# Patient Record
Sex: Male | Born: 1950 | Race: Black or African American | Hispanic: No | Marital: Single | Smoking: Former smoker
Health system: Southern US, Community
[De-identification: ages and names within clinical notes are randomized; demographics above are authoritative.]

## PROBLEM LIST (undated history)

## (undated) DIAGNOSIS — I10 Essential (primary) hypertension: Secondary | ICD-10-CM

## (undated) DIAGNOSIS — I2510 Atherosclerotic heart disease of native coronary artery without angina pectoris: Secondary | ICD-10-CM

## (undated) DIAGNOSIS — E785 Hyperlipidemia, unspecified: Secondary | ICD-10-CM

## (undated) DIAGNOSIS — K219 Gastro-esophageal reflux disease without esophagitis: Secondary | ICD-10-CM

## (undated) HISTORY — PX: HERNIA REPAIR: SHX51

## (undated) HISTORY — PX: CARDIAC CATHETERIZATION: SHX172

---

## 2004-10-02 ENCOUNTER — Emergency Department: Payer: Self-pay | Admitting: General Practice

## 2004-10-02 ENCOUNTER — Other Ambulatory Visit: Payer: Self-pay

## 2004-10-10 ENCOUNTER — Other Ambulatory Visit: Payer: Self-pay

## 2004-10-10 ENCOUNTER — Inpatient Hospital Stay: Payer: Self-pay | Admitting: Internal Medicine

## 2004-10-11 ENCOUNTER — Other Ambulatory Visit: Payer: Self-pay

## 2004-10-12 ENCOUNTER — Other Ambulatory Visit: Payer: Self-pay

## 2006-05-27 ENCOUNTER — Ambulatory Visit: Payer: Self-pay | Admitting: Internal Medicine

## 2008-07-04 ENCOUNTER — Emergency Department: Payer: Self-pay | Admitting: Emergency Medicine

## 2009-03-10 ENCOUNTER — Emergency Department: Payer: Self-pay | Admitting: Emergency Medicine

## 2009-03-14 ENCOUNTER — Inpatient Hospital Stay: Payer: Self-pay | Admitting: Internal Medicine

## 2010-06-25 ENCOUNTER — Emergency Department: Payer: Self-pay | Admitting: Internal Medicine

## 2010-12-18 ENCOUNTER — Inpatient Hospital Stay: Payer: Self-pay | Admitting: Internal Medicine

## 2010-12-27 LAB — PATHOLOGY REPORT

## 2011-06-02 ENCOUNTER — Inpatient Hospital Stay: Payer: Self-pay | Admitting: Internal Medicine

## 2011-06-18 ENCOUNTER — Inpatient Hospital Stay: Payer: Self-pay | Admitting: Psychiatry

## 2011-06-29 ENCOUNTER — Inpatient Hospital Stay: Payer: Self-pay | Admitting: Psychiatry

## 2011-09-28 ENCOUNTER — Emergency Department: Payer: Self-pay | Admitting: Emergency Medicine

## 2012-08-15 ENCOUNTER — Emergency Department: Payer: Self-pay | Admitting: Emergency Medicine

## 2012-08-15 LAB — COMPREHENSIVE METABOLIC PANEL
Alkaline Phosphatase: 119 U/L (ref 50–136)
BUN: 16 mg/dL (ref 7–18)
Bilirubin,Total: 0.2 mg/dL (ref 0.2–1.0)
Calcium, Total: 9.8 mg/dL (ref 8.5–10.1)
Chloride: 107 mmol/L (ref 98–107)
Co2: 23 mmol/L (ref 21–32)
Creatinine: 1.12 mg/dL (ref 0.60–1.30)
EGFR (African American): >60
Osmolality: 282 (ref 275–301)
Potassium: 3.9 mmol/L (ref 3.5–5.1)
SGPT (ALT): 20 U/L (ref 12–78)
Total Protein: 8.6 g/dL — ABNORMAL HIGH (ref 6.4–8.2)

## 2012-08-15 LAB — CBC
HCT: 47.4 % (ref 40.0–52.0)
MCH: 30.2 pg (ref 26.0–34.0)
MCHC: 33.5 g/dL (ref 32.0–36.0)
MCV: 90 fL (ref 80–100)
RDW: 13.2 % (ref 11.5–14.5)

## 2012-08-15 LAB — URINALYSIS, COMPLETE
Bacteria: NONE SEEN
Blood: NEGATIVE
Ketone: NEGATIVE
Leukocyte Esterase: NEGATIVE
Nitrite: NEGATIVE
Ph: 5 (ref 4.5–8.0)
WBC UR: 1 /HPF (ref 0–5)

## 2012-08-27 ENCOUNTER — Emergency Department: Payer: Self-pay | Admitting: Emergency Medicine

## 2012-08-27 LAB — COMPREHENSIVE METABOLIC PANEL
Albumin: 5.1 g/dL — ABNORMAL HIGH (ref 3.4–5.0)
Alkaline Phosphatase: 127 U/L (ref 50–136)
Bilirubin,Total: 0.3 mg/dL (ref 0.2–1.0)
Calcium, Total: 10 mg/dL (ref 8.5–10.1)
Co2: 22 mmol/L (ref 21–32)
Creatinine: 1.11 mg/dL (ref 0.60–1.30)
EGFR (African American): >60
EGFR (Non-African Amer.): >60
SGOT(AST): 26 U/L (ref 15–37)
SGPT (ALT): 23 U/L (ref 12–78)
Sodium: 138 mmol/L (ref 136–145)

## 2012-08-27 LAB — URINALYSIS, COMPLETE
Bacteria: NONE SEEN
Bilirubin,UR: NEGATIVE
Blood: NEGATIVE
Glucose,UR: NEGATIVE mg/dL (ref 0–75)
Leukocyte Esterase: NEGATIVE
Protein: NEGATIVE
RBC,UR: NONE SEEN /HPF (ref 0–5)
Squamous Epithelial: <1
WBC UR: 1 /HPF (ref 0–5)

## 2012-08-27 LAB — CBC
HCT: 49 % (ref 40.0–52.0)
HGB: 16.4 g/dL (ref 13.0–18.0)
MCH: 30.5 pg (ref 26.0–34.0)
MCHC: 33.4 g/dL (ref 32.0–36.0)
RDW: 13.7 % (ref 11.5–14.5)
WBC: 10.4 10*3/uL (ref 3.8–10.6)

## 2012-08-27 LAB — ETHANOL
Ethanol %: <0.003 % (ref 0.000–0.080)
Ethanol: <3 mg/dL

## 2012-10-01 ENCOUNTER — Ambulatory Visit: Payer: Self-pay | Admitting: Surgery

## 2012-10-01 LAB — BASIC METABOLIC PANEL
Calcium, Total: 9.1 mg/dL (ref 8.5–10.1)
Chloride: 104 mmol/L (ref 98–107)
Creatinine: 0.96 mg/dL (ref 0.60–1.30)
EGFR (Non-African Amer.): >60
Glucose: 86 mg/dL (ref 65–99)
Potassium: 4.4 mmol/L (ref 3.5–5.1)
Sodium: 137 mmol/L (ref 136–145)

## 2012-10-01 LAB — CBC WITH DIFFERENTIAL/PLATELET
Eosinophil #: 1 10*3/uL — ABNORMAL HIGH (ref 0.0–0.7)
Eosinophil %: 14.6 %
HCT: 37 % — ABNORMAL LOW (ref 40.0–52.0)
HGB: 12.3 g/dL — ABNORMAL LOW (ref 13.0–18.0)
Lymphocyte #: 3.2 10*3/uL (ref 1.0–3.6)
Lymphocyte %: 49.6 %
MCV: 91 fL (ref 80–100)
Monocyte %: 7.8 %
Platelet: 298 10*3/uL (ref 150–440)
RDW: 14.1 % (ref 11.5–14.5)
WBC: 6.5 10*3/uL (ref 3.8–10.6)

## 2012-10-08 ENCOUNTER — Ambulatory Visit: Payer: Self-pay | Admitting: Surgery

## 2012-10-13 ENCOUNTER — Inpatient Hospital Stay: Payer: Self-pay | Admitting: Student

## 2012-10-13 LAB — URINALYSIS, COMPLETE
Bacteria: NONE SEEN
Bilirubin,UR: NEGATIVE
Leukocyte Esterase: NEGATIVE
Nitrite: NEGATIVE
RBC,UR: 1 /HPF (ref 0–5)
Specific Gravity: 1.02 (ref 1.003–1.030)
Squamous Epithelial: 3
WBC UR: 4 /HPF (ref 0–5)

## 2012-10-13 LAB — DRUG SCREEN, URINE
Amphetamines, Ur Screen: NEGATIVE (ref ?–1000)
Barbiturates, Ur Screen: NEGATIVE (ref ?–200)
Benzodiazepine, Ur Scrn: POSITIVE (ref ?–200)
Cocaine Metabolite,Ur ~~LOC~~: NEGATIVE (ref ?–300)
MDMA (Ecstasy)Ur Screen: NEGATIVE (ref ?–500)
Methadone, Ur Screen: NEGATIVE (ref ?–300)
Opiate, Ur Screen: POSITIVE (ref ?–300)
Tricyclic, Ur Screen: NEGATIVE (ref ?–1000)

## 2012-10-13 LAB — CBC
HCT: 37 % — ABNORMAL LOW (ref 40.0–52.0)
MCH: 30.6 pg (ref 26.0–34.0)
MCV: 91 fL (ref 80–100)
RBC: 4.09 10*6/uL — ABNORMAL LOW (ref 4.40–5.90)
RDW: 13.7 % (ref 11.5–14.5)
WBC: 10.2 10*3/uL (ref 3.8–10.6)

## 2012-10-13 LAB — COMPREHENSIVE METABOLIC PANEL
Albumin: 4 g/dL (ref 3.4–5.0)
Alkaline Phosphatase: 102 U/L (ref 50–136)
Anion Gap: 15 (ref 7–16)
BUN: 34 mg/dL — ABNORMAL HIGH (ref 7–18)
Bilirubin,Total: 1.1 mg/dL — ABNORMAL HIGH (ref 0.2–1.0)
Calcium, Total: 9.6 mg/dL (ref 8.5–10.1)
Co2: 31 mmol/L (ref 21–32)
Creatinine: 3.18 mg/dL — ABNORMAL HIGH (ref 0.60–1.30)
EGFR (African American): 23 — ABNORMAL LOW
EGFR (Non-African Amer.): 20 — ABNORMAL LOW
Glucose: 107 mg/dL — ABNORMAL HIGH (ref 65–99)
Osmolality: 284 (ref 275–301)
Potassium: 2.6 mmol/L — ABNORMAL LOW (ref 3.5–5.1)
SGPT (ALT): 17 U/L (ref 12–78)
Sodium: 138 mmol/L (ref 136–145)
Total Protein: 8 g/dL (ref 6.4–8.2)

## 2012-10-13 LAB — MAGNESIUM: Magnesium: 1.6 mg/dL — ABNORMAL LOW

## 2012-10-13 LAB — TROPONIN I
Troponin-I: 0.41 ng/mL — ABNORMAL HIGH
Troponin-I: 0.34 ng/mL — ABNORMAL HIGH

## 2012-10-13 LAB — CK TOTAL AND CKMB (NOT AT ARMC): CK-MB: 5.1 ng/mL — ABNORMAL HIGH (ref 0.5–3.6)

## 2012-10-13 LAB — ETHANOL: Ethanol: <3 mg/dL

## 2012-10-14 LAB — CBC WITH DIFFERENTIAL/PLATELET
Basophil #: 0.1 10*3/uL (ref 0.0–0.1)
Basophil %: 0.6 %
Eosinophil #: 0.1 10*3/uL (ref 0.0–0.7)
Eosinophil %: 0.7 %
Lymphocyte #: 1.6 10*3/uL (ref 1.0–3.6)
Lymphocyte %: 17.4 %
MCH: 30.7 pg (ref 26.0–34.0)
MCV: 91 fL (ref 80–100)
Monocyte #: 0.7 x10 3/mm (ref 0.2–1.0)
Monocyte %: 7.2 %
Neutrophil #: 7 10*3/uL — ABNORMAL HIGH (ref 1.4–6.5)
Neutrophil %: 74.1 %
Platelet: 322 10*3/uL (ref 150–440)
RBC: 3.46 10*6/uL — ABNORMAL LOW (ref 4.40–5.90)
RDW: 13.8 % (ref 11.5–14.5)
WBC: 9.4 10*3/uL (ref 3.8–10.6)

## 2012-10-14 LAB — TROPONIN I: Troponin-I: 0.28 ng/mL — ABNORMAL HIGH

## 2012-10-14 LAB — BASIC METABOLIC PANEL
Anion Gap: 9 (ref 7–16)
BUN: 41 mg/dL — ABNORMAL HIGH (ref 7–18)
Chloride: 105 mmol/L (ref 98–107)
Co2: 29 mmol/L (ref 21–32)
Creatinine: 2.66 mg/dL — ABNORMAL HIGH (ref 0.60–1.30)
EGFR (African American): 28 — ABNORMAL LOW
EGFR (Non-African Amer.): 24 — ABNORMAL LOW

## 2012-10-14 LAB — CK TOTAL AND CKMB (NOT AT ARMC)
CK, Total: 538 U/L — ABNORMAL HIGH (ref 35–232)
CK-MB: 4.9 ng/mL — ABNORMAL HIGH (ref 0.5–3.6)

## 2012-10-15 LAB — BASIC METABOLIC PANEL
BUN: 43 mg/dL — ABNORMAL HIGH (ref 7–18)
Chloride: 104 mmol/L (ref 98–107)
Creatinine: 1.47 mg/dL — ABNORMAL HIGH (ref 0.60–1.30)
EGFR (Non-African Amer.): 50 — ABNORMAL LOW
Glucose: 64 mg/dL — ABNORMAL LOW (ref 65–99)
Osmolality: 290 (ref 275–301)
Potassium: 3 mmol/L — ABNORMAL LOW (ref 3.5–5.1)

## 2012-11-24 ENCOUNTER — Emergency Department: Payer: Self-pay | Admitting: Emergency Medicine

## 2012-11-24 LAB — URINALYSIS, COMPLETE
RBC,UR: 95725 /HPF (ref 0–5)
Squamous Epithelial: NONE SEEN

## 2012-11-24 LAB — CBC
HCT: 35.3 % — ABNORMAL LOW (ref 40.0–52.0)
HGB: 11.6 g/dL — ABNORMAL LOW (ref 13.0–18.0)
MCHC: 32.8 g/dL (ref 32.0–36.0)
MCV: 91 fL (ref 80–100)
Platelet: 395 10*3/uL (ref 150–440)
WBC: 8.4 10*3/uL (ref 3.8–10.6)

## 2012-11-24 LAB — BASIC METABOLIC PANEL
Anion Gap: 9 (ref 7–16)
Chloride: 105 mmol/L (ref 98–107)
Creatinine: 1.36 mg/dL — ABNORMAL HIGH (ref 0.60–1.30)
EGFR (Non-African Amer.): 55 — ABNORMAL LOW
Glucose: 129 mg/dL — ABNORMAL HIGH (ref 65–99)
Osmolality: 277 (ref 275–301)
Potassium: 4 mmol/L (ref 3.5–5.1)
Sodium: 137 mmol/L (ref 136–145)

## 2013-01-13 ENCOUNTER — Inpatient Hospital Stay: Payer: Self-pay | Admitting: Specialist

## 2013-01-13 LAB — COMPREHENSIVE METABOLIC PANEL
Albumin: 4.5 g/dL (ref 3.4–5.0)
Alkaline Phosphatase: 131 U/L (ref 50–136)
Anion Gap: 9 (ref 7–16)
Bilirubin,Total: 0.4 mg/dL (ref 0.2–1.0)
Calcium, Total: 10.2 mg/dL — ABNORMAL HIGH (ref 8.5–10.1)
Chloride: 102 mmol/L (ref 98–107)
Co2: 24 mmol/L (ref 21–32)
Creatinine: 1.38 mg/dL — ABNORMAL HIGH (ref 0.60–1.30)
EGFR (African American): >60
EGFR (Non-African Amer.): 54 — ABNORMAL LOW
Glucose: 125 mg/dL — ABNORMAL HIGH (ref 65–99)
Osmolality: 275 (ref 275–301)
Sodium: 135 mmol/L — ABNORMAL LOW (ref 136–145)
Total Protein: 9.4 g/dL — ABNORMAL HIGH (ref 6.4–8.2)

## 2013-01-13 LAB — CBC
HGB: 13.3 g/dL (ref 13.0–18.0)
MCHC: 32.6 g/dL (ref 32.0–36.0)
RBC: 4.61 10*6/uL (ref 4.40–5.90)
WBC: 7.7 10*3/uL (ref 3.8–10.6)

## 2013-01-13 LAB — URINALYSIS, COMPLETE
Bilirubin,UR: NEGATIVE
Glucose,UR: NEGATIVE mg/dL (ref 0–75)
Nitrite: NEGATIVE
Ph: 5 (ref 4.5–8.0)
Specific Gravity: 1.02 (ref 1.003–1.030)
WBC UR: 551 /HPF (ref 0–5)

## 2013-01-13 LAB — TROPONIN I: Troponin-I: <0.02 ng/mL

## 2013-01-13 LAB — MAGNESIUM: Magnesium: 1.8 mg/dL

## 2013-01-14 LAB — BASIC METABOLIC PANEL
Anion Gap: 10 (ref 7–16)
BUN: 18 mg/dL (ref 7–18)
Chloride: 107 mmol/L (ref 98–107)
Creatinine: 1 mg/dL (ref 0.60–1.30)
EGFR (Non-African Amer.): >60
Glucose: 73 mg/dL (ref 65–99)
Osmolality: 276 (ref 275–301)
Potassium: 3.8 mmol/L (ref 3.5–5.1)
Sodium: 138 mmol/L (ref 136–145)

## 2013-01-14 LAB — CBC WITH DIFFERENTIAL/PLATELET
Basophil %: 0.5 %
Eosinophil #: 0.2 10*3/uL (ref 0.0–0.7)
HGB: 11.4 g/dL — ABNORMAL LOW (ref 13.0–18.0)
Lymphocyte #: 2.6 10*3/uL (ref 1.0–3.6)
Lymphocyte %: 41.3 %
MCHC: 33.1 g/dL (ref 32.0–36.0)
MCV: 88 fL (ref 80–100)
Monocyte #: 0.6 x10 3/mm (ref 0.2–1.0)
Monocyte %: 9.5 %
Neutrophil #: 2.9 10*3/uL (ref 1.4–6.5)
Platelet: 337 10*3/uL (ref 150–440)
RDW: 13.5 % (ref 11.5–14.5)
WBC: 6.4 10*3/uL (ref 3.8–10.6)

## 2013-01-15 LAB — URINE CULTURE

## 2013-03-27 ENCOUNTER — Emergency Department: Payer: Self-pay | Admitting: Emergency Medicine

## 2013-03-27 DIAGNOSIS — S066X0A Traumatic subarachnoid hemorrhage without loss of consciousness, initial encounter: Secondary | ICD-10-CM | POA: Insufficient documentation

## 2013-03-27 LAB — COMPREHENSIVE METABOLIC PANEL
Albumin: 4.4 g/dL (ref 3.4–5.0)
Alkaline Phosphatase: 114 U/L (ref 50–136)
Anion Gap: 9 (ref 7–16)
Calcium, Total: 9.7 mg/dL (ref 8.5–10.1)
Chloride: 108 mmol/L — ABNORMAL HIGH (ref 98–107)
Creatinine: 2.51 mg/dL — ABNORMAL HIGH (ref 0.60–1.30)
Osmolality: 275 (ref 275–301)
Potassium: 4.5 mmol/L (ref 3.5–5.1)
SGOT(AST): 27 U/L (ref 15–37)
SGPT (ALT): 18 U/L (ref 12–78)
Total Protein: 8.7 g/dL — ABNORMAL HIGH (ref 6.4–8.2)

## 2013-03-27 LAB — CBC WITH DIFFERENTIAL/PLATELET
Eosinophil #: 1.3 10*3/uL — ABNORMAL HIGH (ref 0.0–0.7)
Eosinophil %: 14.3 %
HGB: 13 g/dL (ref 13.0–18.0)
Lymphocyte #: 3.6 10*3/uL (ref 1.0–3.6)
Lymphocyte %: 40.9 %
MCH: 29 pg (ref 26.0–34.0)
MCHC: 33.2 g/dL (ref 32.0–36.0)
Monocyte %: 8.4 %
Neutrophil #: 3.2 10*3/uL (ref 1.4–6.5)
Platelet: 301 10*3/uL (ref 150–440)
RBC: 4.48 10*6/uL (ref 4.40–5.90)
RDW: 14.3 % (ref 11.5–14.5)
WBC: 8.9 10*3/uL (ref 3.8–10.6)

## 2013-03-27 LAB — ETHANOL: Ethanol: <3 mg/dL

## 2013-03-27 LAB — CK TOTAL AND CKMB (NOT AT ARMC): CK-MB: 2.2 ng/mL (ref 0.5–3.6)

## 2013-03-30 DIAGNOSIS — S37009A Unspecified injury of unspecified kidney, initial encounter: Secondary | ICD-10-CM | POA: Insufficient documentation

## 2013-03-30 DIAGNOSIS — R799 Abnormal finding of blood chemistry, unspecified: Secondary | ICD-10-CM | POA: Insufficient documentation

## 2013-03-31 DIAGNOSIS — Z720 Tobacco use: Secondary | ICD-10-CM | POA: Insufficient documentation

## 2013-05-23 ENCOUNTER — Emergency Department: Payer: Self-pay | Admitting: Emergency Medicine

## 2013-10-21 ENCOUNTER — Observation Stay: Payer: Self-pay | Admitting: Internal Medicine

## 2013-10-21 LAB — COMPREHENSIVE METABOLIC PANEL
Albumin: 3.9 g/dL (ref 3.4–5.0)
Alkaline Phosphatase: 113 U/L
Co2: 24 mmol/L (ref 21–32)
Creatinine: 1.19 mg/dL (ref 0.60–1.30)
EGFR (African American): >60
Glucose: 87 mg/dL (ref 65–99)
Osmolality: 272 (ref 275–301)
SGOT(AST): 32 U/L (ref 15–37)
SGPT (ALT): 17 U/L (ref 12–78)
Sodium: 135 mmol/L — ABNORMAL LOW (ref 136–145)

## 2013-10-21 LAB — CBC
HCT: 37.5 % — ABNORMAL LOW (ref 40.0–52.0)
HGB: 12.6 g/dL — ABNORMAL LOW (ref 13.0–18.0)
MCH: 29.8 pg (ref 26.0–34.0)
MCHC: 33.5 g/dL (ref 32.0–36.0)
MCV: 89 fL (ref 80–100)
Platelet: 314 10*3/uL (ref 150–440)
RBC: 4.21 10*6/uL — ABNORMAL LOW (ref 4.40–5.90)

## 2013-10-21 LAB — LIPID PANEL
Cholesterol: 122 mg/dL (ref 0–200)
Ldl Cholesterol, Calc: 57 mg/dL (ref 0–100)
Triglycerides: 62 mg/dL (ref 0–200)
VLDL Cholesterol, Calc: 12 mg/dL (ref 5–40)

## 2013-10-21 LAB — TROPONIN I: Troponin-I: <0.02 ng/mL

## 2013-10-21 LAB — CK TOTAL AND CKMB (NOT AT ARMC): CK, Total: 67 U/L (ref 35–232)

## 2013-10-21 LAB — HEMOGLOBIN A1C: Hemoglobin A1C: 5.5 % (ref 4.2–6.3)

## 2013-10-22 LAB — CBC WITH DIFFERENTIAL/PLATELET
Eosinophil: 13 %
HGB: 11.7 g/dL — ABNORMAL LOW (ref 13.0–18.0)
Lymphocytes: 61 %
MCV: 88 fL (ref 80–100)
Monocytes: 3 %
Platelet: 314 10*3/uL (ref 150–440)
RBC: 3.91 10*6/uL — ABNORMAL LOW (ref 4.40–5.90)
RDW: 13.2 % (ref 11.5–14.5)
Segmented Neutrophils: 22 %
WBC: 6.6 10*3/uL (ref 3.8–10.6)

## 2013-10-22 LAB — BASIC METABOLIC PANEL
Anion Gap: 6 — ABNORMAL LOW (ref 7–16)
Chloride: 111 mmol/L — ABNORMAL HIGH (ref 98–107)
EGFR (Non-African Amer.): >60
Glucose: 87 mg/dL (ref 65–99)
Potassium: 4.3 mmol/L (ref 3.5–5.1)
Sodium: 141 mmol/L (ref 136–145)

## 2014-03-25 DIAGNOSIS — F17200 Nicotine dependence, unspecified, uncomplicated: Secondary | ICD-10-CM | POA: Insufficient documentation

## 2014-03-25 DIAGNOSIS — R079 Chest pain, unspecified: Secondary | ICD-10-CM | POA: Insufficient documentation

## 2014-03-25 DIAGNOSIS — I209 Angina pectoris, unspecified: Secondary | ICD-10-CM | POA: Insufficient documentation

## 2014-05-21 IMAGING — CR DG CHEST 2V
1 series · 2 of 2 positions shown · non-contrast
Comparison: 03/27/2013

CLINICAL DATA: Chest pain.

EXAM:
CHEST  2 VIEW

[Series 1: pa · 0.17mm/px · 2 of 2 slices shown]
[im 1/2]
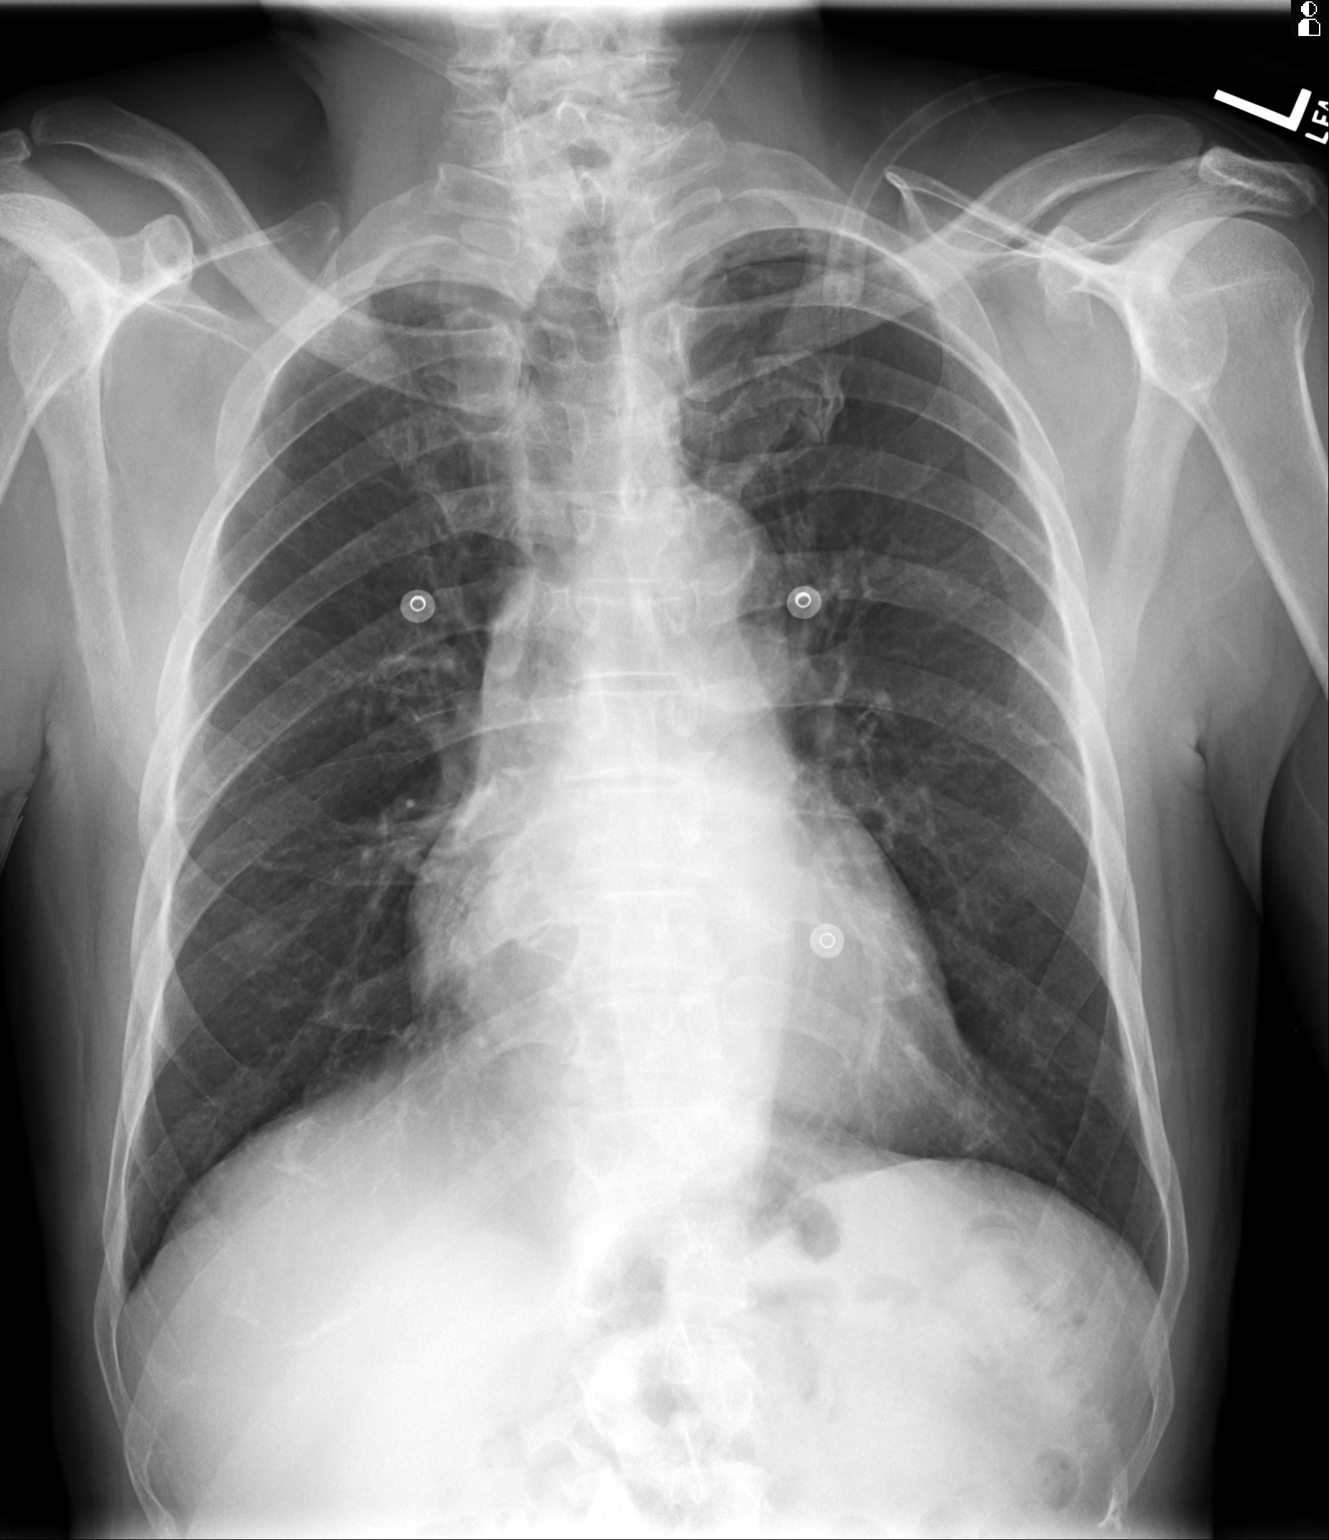
[im 2/2]
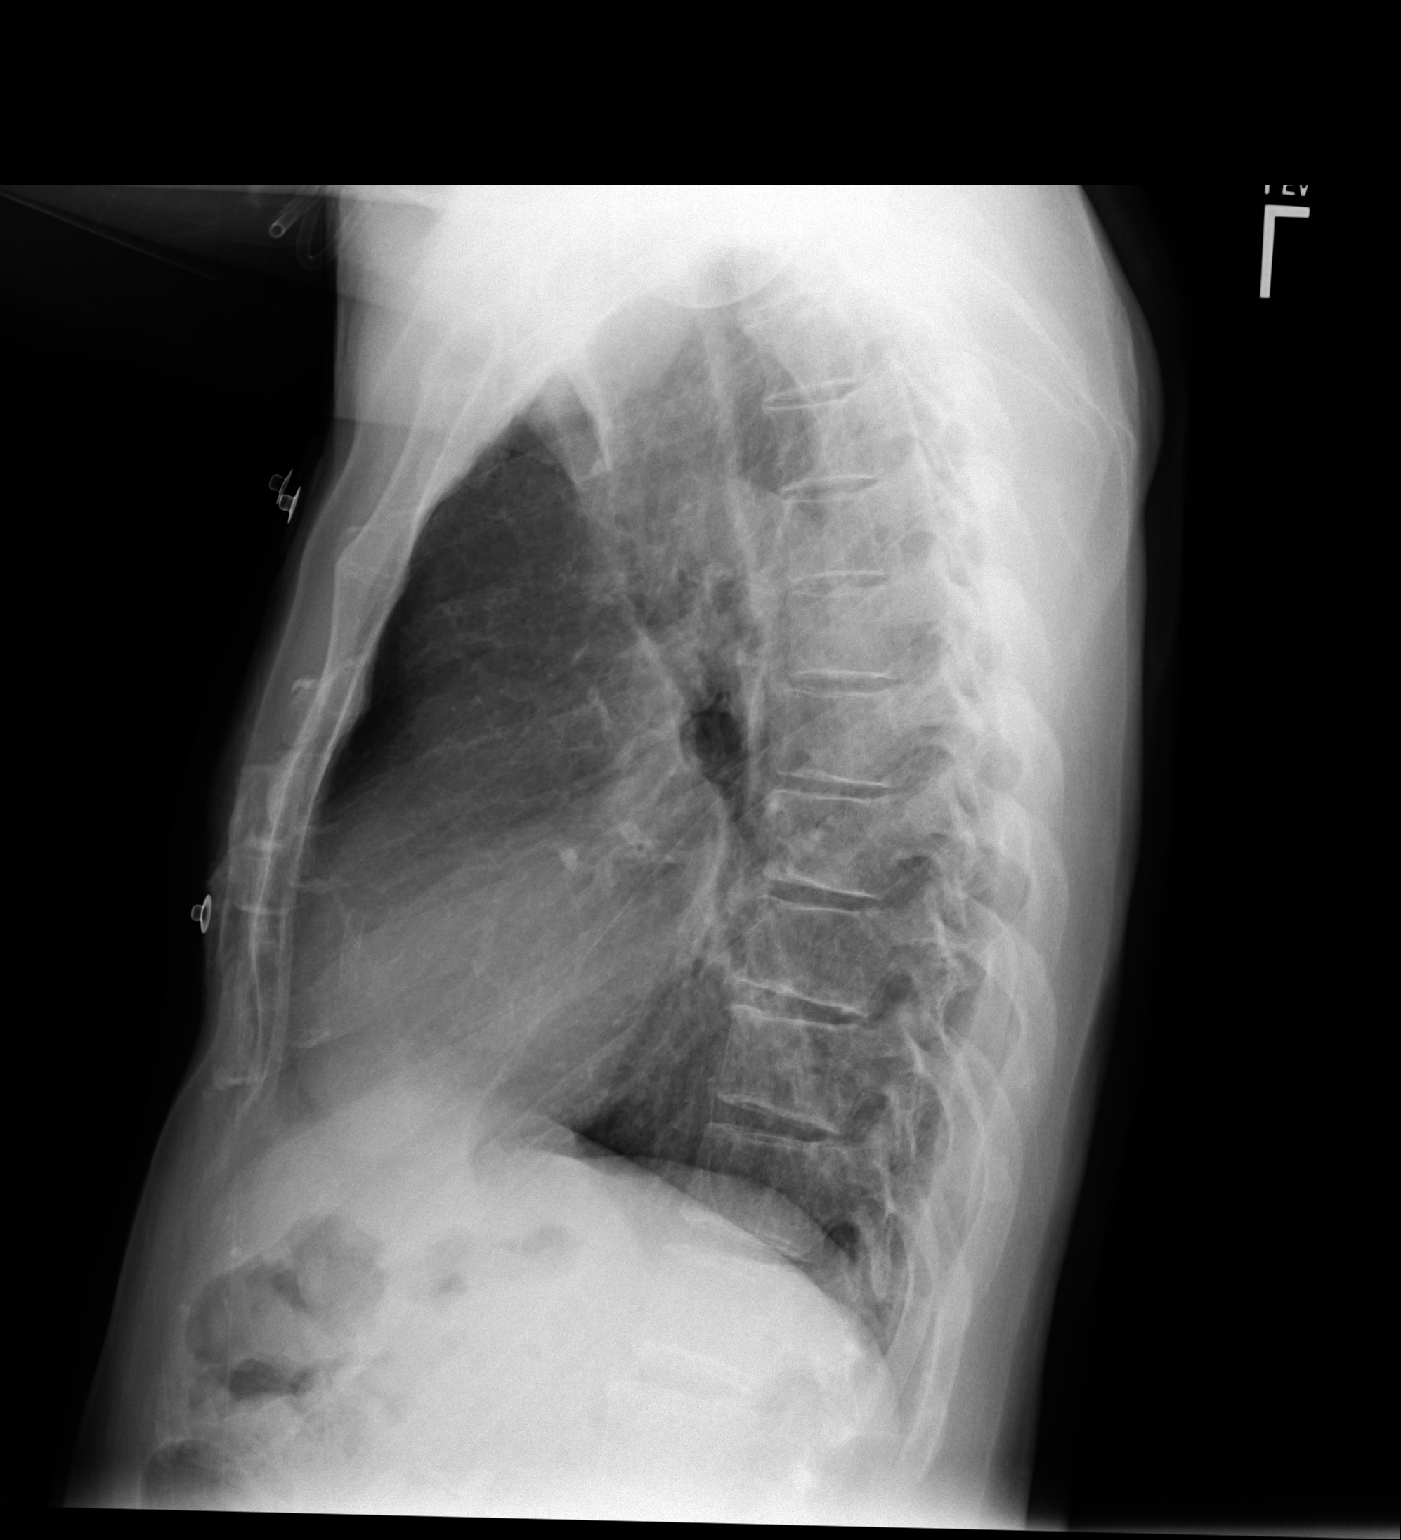

[2 of 2 positions shown; findings below may reference images not displayed]

FINDINGS: The cardiac silhouette is enlarged. No focal regions of
consolidation or focal infiltrates. There is flattening of the
hemidiaphragms. The osseous structures unremarkable.
IMPRESSION: COPD without evidence of acute cardiopulmonary disease.

## 2015-02-22 NOTE — Discharge Summary (Signed)
PATIENT NAME:  Antonio Ford, Antonio Ford MR#:  [id number] DATE OF BIRTH:  Dec 23, 1950  DATE OF ADMISSION:  10/13/2012 DATE OF DISCHARGE:  10/16/2012  PRIMARY CARE PHYSICIAN: Dr. Maryellen PileEason   CHIEF COMPLAINT: Abdominal pain.   DISCHARGE DIAGNOSES:  1. Ileus.  2. Acute renal failure.  3. Hypokalemia.  4. Hypomagnesemia.  5. Accelerated hypertension.  6. History of schizophrenia. 7. Positive troponin, likely demand ischemia and renal failure.  8. History of hypothyroidism.  9. Diabetes.  10. Down syndrome. 11. Alcohol abuse.  12. Peripheral arterial disease.  13. Gastroesophageal reflux disease.  14. Hydrocephalus history.  15. History of cardiac stent.  16. History of recent inguinal hernia repair.   DISCHARGE MEDICATIONS:  1. Aspirin 81 mg daily.  2. Omeprazole 20 mg daily.  3. Fluphenazine 25 mg/mL injectable solution 0.6 mL every two weeks.  4. Lipitor 10 mg daily.  5. Lisinopril 10 mg daily.  6. Metoprolol succinate 100 mg 1 tab 1 daily.  7. Tylenol 650 mg every four hours as needed for pain or temperature greater than 100.4.  8. Amlodipine 10 mg daily.   DIET: Low sodium.   ACTIVITY: As tolerated.   DISCHARGE FOLLOW-UP:  1. Please follow with PCP within 1 to 2 weeks.  2. Please follow with Surgery for follow-up of the hernia repair within 1 to 2 weeks.   DISPOSITION: Home.   HISTORY OF PRESENT ILLNESS: For full details of history and physical, please see the dictation on 10/13/2012 by Dr. Juliene PinaMody. Briefly, this is a 64 year old male who presented with abdominal pain. Of note, he has had a recent incarcerated right inguinal hernia repair on 12/04. He came in with nausea and vomiting and was found to have x-ray suggesting ileus versus SBO. He also had renal failure. He was admitted to the hospitalist service and Surgery was consulted.   SIGNIFICANT LABS AND IMAGING: BUN 34 on arrival. Creatinine 3.18. By discharge it was 1.47 as of December 11. Potassium was 3. Initial troponin 0.41, then  0.34, then 0.28. Initial CK-MB 1.7, then 5.1, then 4.9. Urine toxicology positive for benzodiazepine, cannabinoids, and opiates. Initial WBC 10.2. Hemoglobin 12.5. Urine cultures from December 10th no growth to date. Urinalysis not suggestive of infection.   Initial x-ray abdomen, 3-way, including PA of the chest showing nonspecific bowel gas pattern. Lungs appear inflated and clear. Loops of small and large bowel which could represent ileus. No obstruction or perforation.   Repeat abdominal flat and erect x-ray on December 10th showing nonobstructive bowel gas pattern and no definitive free intraperitoneal air.   HOSPITAL COURSE: The patient was admitted to the hospitalist service. Surgery was consulted. He did have acute renal failure on arrival as well. Per Surgery, the patient's ileus is likely related to opiates. The incision from the inguinal hernia repair was healing well per Surgery and no sign of hematoma or infection. The right lower quadrant pain which he had was likely related to surgery. He was placed on clears and diet was advanced. He did have episodic nausea and vomiting, however, repeat x-ray of the chest shows no significant ileus. The patient has been having bowel movements and passing gas. He did have bouts of uncontrolled hypertension and his blood pressure medications were adjusted. He had hypokalemia and hypomagnesia repleted. His renal function has significantly improved. He did have positive troponins but did not have any chest pains and no significant ST elevations or depressions on EKG. His aspirin and beta-blocker were continued and he was also discharged on a  statin. As he did not have any significant pain in the chest, he was not seen by Cardiology. The elevated troponin was likely demand related as well as in the setting of renal failure. At this point he has been tolerating advanced diet and he will be discharged with outpatient follow-up.   TOTAL TIME SPENT: 34 minutes.    CODE STATUS: FULL CODE.   ____________________________ Krystal Eaton, MD sa:drc D: 10/16/2012 16:28:21 ET T: 10/17/2012 11:57:43 ET JOB#: [id number]  cc: Krystal Eaton, MD, <Dictator> Serita Sheller. Maryellen Pile, MD Krystal Eaton MD ELECTRONICALLY SIGNED 11/04/2012 14:08

## 2015-02-22 NOTE — Consult Note (Signed)
Brief Consult Note: Diagnosis: CAD Abn Enzymes Post op from hernia.   Patient was seen by consultant.   Recommend further assessment or treatment.   Orders entered.   Discussed with Attending MD.   Comments: IMP CAD Elevated enzymes S/P hernia surgery Abd pain . PLAN Conservative cardiac care Agree with tele Advance diet as tolerated Do not recanticoug Agree wit continued post op care F/U cardiac enzymes as necessary No invasive cardiac studies at this point.  Electronic Signatures: Dorothyann Pengallwood, Kerigan Narvaez D (MD)  (Signed 12-Dec-13 11:13)  Authored: Brief Consult Note   Last Updated: 12-Dec-13 11:13 by Alwyn Peaallwood, Darby Fleeman D (MD)

## 2015-02-22 NOTE — Op Note (Signed)
PATIENT NAME:  Rockwell GermanyDOWNEY, Mitsuo MR#:  [id number] DATE OF BIRTH:  12/16/1950  DATE OF PROCEDURE:  10/08/2012  PREOPERATIVE DIAGNOSIS: Incarcerated right inguinal hernia.   POSTOPERATIVE DIAGNOSIS: Incarcerated right inguinal hernia.  PROCEDURE PERFORMED: Open right inguinal hernia repair with Lichtenstein technique and mesh, high ligation of sac.   FINDINGS: Incarcerated small bowel, large indirect chronic-appearing hernia sac.   SURGEON: Jaiona Simien A. Egbert GaribaldiBird, MD  ASSISTANT: None.   ANESTHESIA: General with 20 mL of 0.25% plain Marcaine.   DESCRIPTION OF PROCEDURE: With the patient in supine position, general endotracheal anesthesia was induced. The right groin was sterilely prepped and draped with ChloraPrep solution followed by [place]. Timeout was observed.   A slightly obliquely oriented and transverse incision was fashioned in the right groin approximately one fingers breadth above the inguinal crease. There was a large incarcerated hernia in this area. Careful dissection with sharp dissection and electrocautery with division of bridging veins in the subcutaneous tissues between clamps and ties of 2-0 Vicryl exposed external oblique aponeurosis which was incised laterally and carried through the external ring. A large indirect right inguinal hernia sac was identified with elevation of the cord off the cremasterics and pubic tubercle with blunt technique and Penrose drain. The hernia sac was opened at its midportion allowing it to be dissected off vas and vessels. Several loops of small intestine were identified, found to be viable and returned back into the peritoneal cavity. There was a 1.5 cm fascial defect at internal ring. No cloudy fluid was encountered.   With the hernia sac dissected off of the vas and vessels, it was ligated at its base with a #0 silk suture in running locking fashion due to its large width. Excess hernia sac and cord lipoma were excised with cautery and clamped and tie of 2-0  Vicryl and submitted as specimen.   The course of the ilioinguinal nerve became stretched and as such, with an additional application of cautery to this area, I elected to resect the nerve between clamps and ties of 2-0 Vicryl. A section of nerve was excised.   The keyholed prolene mesh was brought onto the field and then secured to the pubic tubercle with 2-0 Vicryl, shelving edge of the inguinal ligament with 2-0 Vicryl in interrupted fashion, conjoined tendon with interrupted 2-0 Vicryl and the two tails crisscrossed allowing egress of the spermatic cord. One suture was placed at the interstices of the cross of mesh tails to re-create the internal ring.   Lap and needle count correct x2. The external oblique aponeurosis was reapproximated from lateral to medial utilizing 2-0 Vicryl. Scarpa's fascia was reapproximated utilizing 3-0 Vicryl. 4-0 Vicryl subcuticular was applied to the skin followed by application of benzoin, Steri-Strips, Telfa, and Tegaderm. The patient was then subsequently extubated and taken to the recovery room in stable and satisfactory condition by anesthesia services.   ____________________________ Redge GainerMark A. Egbert GaribaldiBird, MD mab:cms D: 10/08/2012 10:56:56 ET T: 10/08/2012 11:16:36 ET JOB#: [id number]  cc: Loraine LericheMark A. Egbert GaribaldiBird, MD, <Dictator>  Raynald KempMARK A Muhamad Serano MD ELECTRONICALLY SIGNED 10/08/2012 15:52

## 2015-02-22 NOTE — H&P (Signed)
PATIENT NAME:  Antonio GermanyDOWNEY, Antonio MR#:  [id number] DATE OF BIRTH:  08-Apr-1951  DATE OF ADMISSION:  10/13/2012  PRIMARY CARE PHYSICIAN: Toy CookeyErnest Eason, MD  CHIEF COMPLAINT: Abdominal pain.   HISTORY OF PRESENT ILLNESS: The patient is a 64 year old male with a history of diabetes, Down syndrome, and coronary artery disease who presents with abdominal pain. The patient is status post incarcerated right inguinal hernia, on 10/08/2012. For the past few days, the patient reports nausea, vomiting, and lower abdominal pain. In the ER, his x-ray shows possible ileus versus SBO. He is also noted to have a creatinine of 3.18.   REVIEW OF SYSTEMS: CONSTITUTIONAL: The patient denies fever. He has fatigue, weakness, and abdominal pain. EYES: No blurred or double vision. ENT: No ear pain, hearing loss, seasonal allergies, postnasal drip, or sinus pain. RESPIRATORY: Denies any cough, wheezing, hemoptysis, or dyspnea. CARDIOVASCULAR: He denies any chest pain, orthopnea, or edema. He does have pinpoint pain in his sternum which he says is worse with drinking fluids but not eating. GI: Positive nausea and vomiting. No diarrhea. Positive abdominal pain. No melena or ulcers. GU: No dysuria or hematuria. ENDOCRINE: No polyuria or polydipsia. HEME/LYMPH: Positive anemia. SKIN: No rash or lesions. MUSCULOSKELETAL: No gout. NEURO: No history of cerebrovascular accident or transient ischemic attacks. He does have Down's syndrome. PSYCH: History of schizophrenia.   PAST MEDICAL HISTORY:  1. Gastroesophageal reflux disease. 2. Schizophrenia.  3. Hypothyroidism.  4. Diabetes.  5. Hypertension.  6. Down's syndrome. 7. Alcohol abuse.  8. Peripheral artery disease. 9. Anxiety. 10. Hyperlipidemia.  11. Hydrocephalus.  12. Cardiac stent.   MEDICATIONS:  1. Aspirin 81 mg daily.  2. Fluphenazine 25 mg/0.6 mL every two weeks.  3. Lipitor 10 mg daily.  4. Lisinopril 10 mg daily.  5. Metoprolol 100 mg daily.  6. Norco 5/325 mg 1  tablet every 4 to 6 hours p.r.n. pain.  7. Omeprazole 20 mg daily. 8. Zofran p.r.n.   SOCIAL HISTORY: The patient says he smokes 2 to 3 cigarettes a day, not interested in quitting at this time. No alcohol or IV drug use. He lives with his sister.   PAST SURGICAL HISTORY:  1. Recent incarcerated inguinal hernia repair on 10/08/2012. 2. Cardiac stent.   ALLERGIES: No known drug allergies.   FAMILY HISTORY: Unknown.   PHYSICAL EXAMINATION:   VITAL SIGNS: Temperature 97.5, pulse 93, respirations 24, blood pressure 124/67, and 99% on 2 liters.   GENERAL: The patient is alert and oriented, not in acute distress.  HEENT: Head is atraumatic. Pupils are round. Sclerae anicteric. Mucous membranes are dry. Oropharynx is clear.   NECK: Supple without JVD, carotid bruit, or enlarged thyroid.  CARDIOVASCULAR: Regular rate and rhythm. No murmurs, gallops, or rubs. PMI is not displaced chest.   PULMONARY: Lungs are clear to auscultation bilaterally without crackles, rales, rhonchi, or wheezing. Chest is nontender.   ABDOMEN: Bowel sounds are positive. No distention. He has generalized tenderness at the lower right quadrant without any rebound or guarding and his hernia scar looks clean and intact.   EXTREMITIES: No clubbing, cyanosis, or edema.   SKIN: No rash or lesions.   NEUROLOGIC: Cranial nerves II through XII are grossly intact. Motor and sensation is intact. He does not know the date or the year.   PERTINENT LABORATORY: Sodium 138, potassium 2.6, chloride 92, bicarbonate 31, BUN 34, creatinine 3.18, and glucose 107. Magnesium 1.6. Alcohol level less than 3. Total protein 8.0, albumin 4.0, bilirubin 1.1, alkaline phosphatase 102,  AST 34, and ALT 17. Troponin 0.41.   Urine drug screen is positive for marijuana and opiates.   White blood cells 10.2, hemoglobin 12.5, hematocrit 37, and platelets 363.   Urinalysis shows no leukocyte esterase or nitrites.   Abdominal x-ray shows  possible ileus versus resolving bowel obstruction.  HOSPITAL COURSE: This is a 64 year old male status post recent incarcerated inguinal hernia repair who presents with vomiting and abdominal pain with possible ileus.  1. Vomiting with abdominal pain, possible ileus versus early small bowel obstruction. Surgery has been consulted. Cannot obtain a CT with contrast due to acute renal failure. The patient will be n.p.o. except for medications right now, IV fluids supportive care. He does not have any distention of his abdomen and at this time I do not think he needs a NG tube. We will continue with serial abdominal exams as well as abdominal x-ray for tomorrow.  2. Hypokalemia/hypomagnesemia due to vomiting. We will continue to replete gently as the creatinine is elevated.  3. Acute renal failure possibly from ACE inhibitors or dehydration from vomiting and decreased p.o. intake. We will provide IV fluids, monitor creatinine carefully. If the creatinine does not decrease tomorrow, consider kidney ultrasound and renal consult.  4. Elevated troponin possibly from supply/demand ischemia or renal insufficiency. We will monitor and consult cardiology as the patient does have a history of coronary artery disease.   CODE STATUS: FULL CODE.  TIME SPENT: Approximately 45 minutes.  ____________________________ Janyth Contes. Juliene Pina, MD spm:slb D: 10/13/2012 12:06:01 ET T: 10/13/2012 12:27:28 ET JOB#: [id number]  cc: Kadee Philyaw P. Juliene Pina, MD, <Dictator> Serita Sheller. Maryellen Pile, MD Janyth Contes Reign Bartnick MD ELECTRONICALLY SIGNED 10/13/2012 12:54

## 2015-02-25 NOTE — H&P (Signed)
PATIENT NAME:  Antonio Ford, Antonio Ford MR#:  [id number] DATE OF BIRTH:  [birth year]  DATE OF ADMISSION:  01/13/2013  PRIMARY CARE PHYSICIAN: Alden Server B. Maryellen Pile, MD  REFERRING EMERGENCY ROOM PHYSICIAN: Caleen Jobs. Braud, MD   CHIEF COMPLAINT: Nausea, vomiting, abdominal pain.   HISTORY OF PRESENTING ILLNESS: A 64 year old male patient with history of GERD, diabetes, hypertension, Down syndrome, who presents to the hospital with complaints of nausea, vomiting and periumbilical abdominal pain. The patient, in the Emergency Room, started to throw up continuously, received 2 doses of Zofran, presently still is retching. Was found to have bladder distention with thickened urinary bladder with urinary tract infection. Has a Foley catheter placed, and hospitalist team has been consulted.   The patient is being admitted for UTI as the patient is throwing up continuously, he is unable to keep his antibiotics down and is high risk for further deterioration.   The patient had an inguinal hernia repair in December 2013, since when he has had on-and-off nausea and vomiting, but this episode has been much worse, and the patient has been unable to keep his blood pressure medications down. His initial blood pressure was 220/115 in the Emergency Room.   REVIEW OF SYSTEMS:   CONSTITUTIONAL: No fever. Complains of weakness. No weight loss or weight gain.  EYES: No blurred vision, pain or redness.  ENT: No ear pain, hearing loss or allergies.  RESPIRATORY: No shortness of breath, wheezing, hemoptysis.  CARDIOVASCULAR: No chest pain, edema.  GASTROINTESTINAL: Has nausea, vomiting, abdominal pain.  GENITOURINARY: No dysuria, hematuria. Has had urinary retention.  ENDOCRINE: No polyuria, polydipsia or thyroid problems.  HEMATOLOGIC AND LYMPHATIC: No easy bruising or bleeding. Has anemia.  SKIN: No rash or lesions.  MUSCULOSKELETAL: No gout or redness or swelling.  NEUROLOGICAL: Has Down syndrome. No focal numbness or weakness,   PSYCHIATRIC: Has schizophrenia.   PAST MEDICAL HISTORY: GERD, schizophrenia, hypothyroidism, diabetes, hypertension, Down syndrome, alcohol abuse, peripheral artery disease, anxiety, hyperlipidemia, hydrocephalus, cardiac stents.   HOME MEDICATIONS: Include: 1. Aspirin 81 mg daily.  2. Fluphenazine 25 mg every 2 weeks IM.  3. Lipitor 10 mg daily.  4. Lisinopril 10 mg daily.  5. Metoprolol 100 mg daily.  6. Omeprazole 20 mg daily.  7. Zofran 4 mg p.r.n. every 4 hours as needed.   SOCIAL HISTORY: The patient smokes 2 to 3 cigarettes a day. No alcohol. No illicit drugs. Lives with his sister.   CODE STATUS: Full code.   PAST SURGICAL HISTORY: Incarcerated inguinal hernia repair in December 2013 and cardiac stents placed.   ALLERGIES: No known drug allergies.   FAMILY HISTORY: Reviewed and unknown.   PHYSICAL EXAMINATION:  VITAL SIGNS: Show temperature 98.4, pulse of 99, blood pressure 222/103, presently at 223/105.  GENERAL: A frail African-American male patient lying in bed, comfortable, conversational, cooperative to exam.  PSYCHIATRIC: Alert and oriented, in no acute distress. Mood and affect appropriate. Judgment intact.  HEENT: Atraumatic, normocephalic. Oral mucosa moist and pink. External ears and nose normal. No pallor. No icterus. Pupils bilaterally equal and reactive to light.  NECK: Supple. No thyromegaly. No palpable lymph nodes. Trachea midline. No carotid bruit or JVD.  CARDIOVASCULAR: S1 and S2 with PMI displaced outwards.  LUNGS: Clear to auscultation on both sides. Normal work of breathing.  GASTROINTESTINAL: Soft abdomen, nontender. Bowel sounds present. No hepatosplenomegaly palpable.  GENITOURINARY: No CVA tenderness. No bladder distention. Has a Foley catheter placed with dark urine.  SKIN: No petechiae, rash, ulcers. Warm and dry.  MUSCULOSKELETAL:  No joint swelling, redness, effusion of the large joints. Normal muscle tone.  NEUROLOGICAL: Motor strength 5/5 in  upper and lower extremities. Sensation to fine touch intact all over.   LABORATORY STUDIES: Show glucose of 125, BUN 23, creatinine 1.38, sodium of 135, potassium 3.6, albumin 4.5. Troponin less than 0.02. WBC 7.7, hemoglobin 13.3, platelets of 390. Urinalysis shows 551 WBC with 3+ bacteria.   ELECTROCARDIOGRAM: Shows normal sinus rhythm with left ventricular hypertrophy with tall T waves in the lateral leads, unchanged from prior EKG.   CT SCAN OF THE ABDOMEN AND PELVIS: Shows distention of urinary bladder with diffuse bladder wall thickening, markedly enlarged prostate gland. No ileus or bowel obstruction found.   ASSESSMENT AND PLAN:  1. Urinary tract infection causing urinary retention. His nausea and vomiting are likely secondary from the retention pain and urinary tract infection. The patient is high risk for deterioration as he is unable to keep his antibiotics down. Will admit for IV antibiotics. Start on ceftriaxone. Send for blood and urine cultures. Repeat a CBC in the morning. The patient will be started on IV fluids. Continue the Foley catheter. The patient will need followup with urology as he does have an enlarged prostate on CT scan. His bladder wall thickening is likely secondary from the urinary tract infection. The patient is afebrile and normal WBC.  2. Nausea and vomiting. Has had a chronic problem since his hernia repair likely secondary from adhesions, but at this time, this is much worse secondary to urinary tract infection. Zofran p.r.n. IV as needed.  3. Acute renal failure secondary to acute tubular necrosis from the urinary tract infection, dehydration and retention. Will start on IV fluids. Repeat a BMP in the morning.  4. Hypertension. The patient is presently in hypertensive urgency. Will give a STAT dose of hydralazine, restart his home medications. This is secondary to his pain and also unable to keep his medications down from the vomiting. Needs close monitoring.  5.  Diabetes mellitus. The patient is not on any diabetic medications, but his medical records have diabetes. Will put him on sliding scale insulin and diabetic diet and follow the blood sugars. The patient might need metformin if he does have new-onset diabetes.  6. Deep vein thrombosis prophylaxis with heparin.  7. Tobacco abuse- Counselled to quit smoking > 3 minutes. Explained cons with HTN/DM and smoking and implications. Pt not considering quitting at this time.  CODE STATUS: Full code.   TIME SPENT: Time spent today on this case was 60 minutes.   ____________________________ Molinda BailiffSrikar R. Sudini, MD srs:OSi D: 01/13/2013 14:09:09 ET T: 01/13/2013 14:35:17 ET JOB#: [id number]  cc: Wardell HeathSrikar R. Sudini, MD, <Dictator> Serita ShellerErnest B. Maryellen PileEason, MD Orie FishermanSRIKAR R SUDINI MD ELECTRONICALLY SIGNED 01/13/2013 15:15

## 2015-02-25 NOTE — Discharge Summary (Signed)
PATIENT NAME:  Antonio Ford, Antonio Ford MR#:  [id number] DATE OF BIRTH:  04/12/1951  DATE OF ADMISSION:  01/13/2013 DATE OF DISCHARGE:  01/16/2013  For a detailed note, please take a look at the history and physical done on admission by Dr. Elpidio Anis.   DIAGNOSES AT DISCHARGE is as follows:   1.  Abdominal pain, nausea, vomiting due to a urinary tract infection.  2.  Urinary tract infection secondary to Escherichia coli.  3.  Acute renal failure, now resolved. 4.  Hypertension. 5.  Hyperlipidemia. 6.  Gastroesophageal reflux disease. 7.  A history of schizophrenia.   DIET:  The patient is being discharged on a low-sodium, low-fat diet.   ACTIVITY:  As tolerated.   FOLLOWUP:  In the next 1 to 2 weeks with Dr. Maryellen Pile, his primary care physician.   MEDICATIONS:  Aspirin 81 mg daily, omeprazole 20 mg daily, Fluphenazine decannulate 25 mg/mL injection 0.6 mL every 2 weeks, Lipitor 10 mg daily, lisinopril 10 mg daily, metoprolol succinate 100 mg daily, amlodipine 10 mg daily and Levaquin 2 mcg daily x 3 days.   PERTINENT STUDIES DONE DURING THE HOSPITAL COURSE AS FOLLOWS:  A CT scan of the abdomen and pelvis done with contrast on admission showing distention of the urinary bladder with diffuse bladder wall thickening, a markedly enlarged prostate gland produces a prominent impression upon the urinary bladder base. No obstruction of the upper urinary tracts, no acute hepatobiliary abnormality.  A urine culture to be positive for Enterococcus faecalis which is pansensitive.   BRIEF HOSPITAL COURSE:  This is a 64 year old male with medical problems as mentioned above presented to the hospital with abdominal pain, nausea, vomiting and an abnormal urinalysis.  1.  Abdominal pain, nausea and vomiting. This was likely secondary to the urinary tract infection. The patient was treated supportively with IV fluids, antiemetics and pain control. The patient with supportive care has clinically improved. He was initially  placed on a clear liquid diet, his diet has been slowly advanced to a full liquid and eventually to a regular diet, which he is now tolerating well with no exacerbations of his abdominal pain, nausea, vomiting. His CT scan of the abdomen and pelvis on admission did not show any acute pathology. This was all thought to be related to a UTI and after treatment with IV antibiotics, his symptoms have resolved.  2.  Urinary tract infection. This was secondary to Enterococcus faecalis as mentioned. Initially the patient was on IV ceftriaxone. He has been switched over to p.o. Levaquin. He will finish treatment with 3 more days of p.o. Levaquin as stated.  3.  Acute renal failure. This was secondary to the nausea, vomiting and dehydration. He has been hydrated, well with IV fluids and his BUN and creatinine have improve and his creatinine is back down to baseline.  4.  Gastroesophageal reflux disease. The patient was continued on omeprazole. He will resume that.  5.  Hyperlipidemia. The patient was maintained on atorvastatin and he will resume that.  6.  Constipation. The patient was given some lactulose which has also resolved his constipation.  7.  Hypertension. The patient remained hemodynamically stable in the hospital. He will continue his Norvasc, metoprolol and lisinopril as stated.   CODE STATUS:  THE PATIENT IS A FULL CODE.   TIME SPENT:  40 minutes.   ____________________________ Rolly Pancake. Cherlynn Kaiser, MD vjs:jm D: 01/16/2013 16:25:46 ET T: 01/17/2013 13:29:37 ET JOB#: [id number]  cc: Rolly Pancake. Cherlynn Kaiser, MD, <Dictator> Serita Sheller. Maryellen Pile, MD  Houston SirenVIVEK J SAINANI MD ELECTRONICALLY SIGNED 01/19/2013 8:17

## 2015-02-26 NOTE — H&P (Signed)
PATIENT NAME:  Antonio Ford, Antonio Ford MR#:  [id number] DATE OF BIRTH:  September 16, 1951  DATE OF ADMISSION:  10/21/2013  ADMITTING PHYSICIAN: Enid Baas, MD  PRIMARY CARE PHYSICIAN: Serita Sheller. Maryellen Pile, MD  CHIEF COMPLAINT: Chest pain.   HISTORY OF PRESENT ILLNESS: Antonio Ford is a 64 year old African American male with past medical history significant for hypertension, coronary artery disease, status post prior stent,  hypothyroidism, schizophrenia, and history of Down syndrome and tobacco use disorder. Presents to the hospital secondary to chest pain. The patient is a poor historian. According to him, he lives at home with his sister. He has been having exertional chest pain since yesterday. He described it as heaviness in the chest, not associated with any dyspnea, nausea, or diaphoresis. He went for his physical today, and during the review of systems he told them about the chest pain. His EKG was sinus bradycardia, and he had peaked T waves in V3 to V5 leads so was sent to the hospital. His first set of troponin is negative. He denies any chest pain currently but because of his cardiac history and EKG changes he is being admitted under observation.   PAST MEDICAL HISTORY: 1.  Coronary artery disease, status post prior stent. 2.  Hypothyroidism.  3.  Hypertension.  4.  Gastroesophageal reflux disease.  5.  Anxiety.  6.  Down syndrome. 7.  Schizophrenia.   PAST SURGICAL HISTORY:  1.  Cardiac stent placement. 2.  Hernia repair.   ALLERGIES TO MEDICATIONS: No known drug allergies.   CURRENT HOME MEDICATIONS:  1.  Amlodipine 10 mg p.o. daily.  2.  Aspirin 81 mg p.o. daily.  3.  Fluphenazine decanoate 25 mg/mL injectable solution, 0.6 mL injectable every 2 weeks.  4.  Lipitor 10 mg p.o. daily.  5.  Lisinopril 10 mg p.o. daily.  6.  Metoprolol succinate 100 mg p.o. daily.  7.  Omeprazole 20 mg p.o. daily.   SOCIAL HISTORY: Lives at home with his sister continues to smoke about 1 pack per day.  Occasional alcohol use.   FAMILY HISTORY: Mom with brain tumor and hypertension and heart problems. Dad died from leukemia.  REVIEW OF SYSTEMS:    CONSTITUTIONAL: No fever, fatigue or weakness.  EYES: Positive for blurred vision. No inflammation, glaucoma or cataracts.  EARS, NOSE, THROAT: No tinnitus, ear pain, hearing pain, epistaxis or discharge.  RESPIRATORY: No cough, wheeze, hemoptysis or COPD.  CARDIOVASCULAR: Positive for chest pain yesterday, currently none. No orthopnea, edema, arrhythmia, palpitations or syncope.  GASTROINTESTINAL: No nausea, vomiting, diarrhea, abdominal pain, hematemesis or melena.  GENITOURINARY: No dysuria, hematochezia, renal calculus, frequency or incontinence.  ENDOCRINE: No polyuria, nocturia, thyroid problems, heat or cold intolerance.  HEMATOLOGIC: No anemia, easy bruising or bleeding.  SKIN: No acne, rash or lesions.  MUSCULOSKELETAL: No neck, back, shoulder pain, arthritis or gout.  NEUROLOGIC: No numbness, weakness, CVA, TIA or seizures.  PSYCHOLOGICAL: No anxiety, insomnia or depression.   PHYSICAL EXAMINATION: VITAL SIGNS: Temperature 98.4 degrees Fahrenheit, pulse 52, respirations 16, blood pressure 140/76, pulse oximetry 98% on room air.  GENERAL: Well-built, well-nourished male lying in bed, not in any acute distress.  HEENT: Normocephalic, atraumatic. Pupils equal, round, reacting to light. Anicteric sclerae. Extraocular movements intact. Oropharynx: Edentulous. No erythema, mass or exudates.  NECK: Supple. No thyromegaly, JVD or carotid bruits. No lymphadenopathy.  LUNGS: Moving air bilaterally. No wheeze or crackles. No use of accessory muscles for breathing.  CARDIOVASCULAR: S1, S2, regular rate and rhythm. No murmurs, rubs or gallops.  ABDOMEN: Soft, nontender, nondistended. No hepatosplenomegaly. Normal bowel sounds.  EXTREMITIES: No pedal edema, no clubbing or cyanosis, 2+ dorsalis pedis pulses palpable bilaterally.  SKIN: No acne, rash  or lesions.  LYMPHATICS: No cervical or inguinal lymphadenopathy.  NEUROLOGIC: Cranial nerves II through XII remain intact. No gross motor or sensory deficits.  PSYCHOLOGICAL: The patient is alert, awake, alert, oriented x 3.   LABORATORY, DIAGNOSTIC AND RADIOLOGICAL DATA: Sodium 135, potassium 4.7, chloride 105, bicarbonate 24, BUN 19, creatinine 1.1 and glucose 87 and calcium of 9.5.   ALT 17, AST 32, alkaline phosphatase 113, total bilirubin 0.3 and albumin of 3.9.   WBC 6.8, hemoglobin 12.6, hematocrit 37.4; platelet count is 314.   First troponin is less than 0.02. CK 91, CK-MB 0.7.  Chest x-ray showing COPD changes with no acute cardiopulmonary changes.   EKG showing bradycardia, heart rate of 43. First degree AV block is present with PR interval > 200 ms and peaked T waves noted in V3, V4, and V5.    ASSESSMENT AND PLAN: A 64 year old male with known history of heart disease and also ongoing smoking and family history of heart disease, presents with chest pain and some EKG changes.  1.  Chest pain: Likely unstable angina.  EKG changes with peaked T waves noted in V3 to V5 leads. Will admit to telemetry for now under observation since his first troponin is negative and the patient is chest pain-free. If troponins remain negative, will get a Myoview. If recurrent chest pain, will need to reorder the EKG. P.r.n. nitroglycerin, aspirin. Continue his lisinopril and statin at this time. Hold the metoprolol long-acting because of the sinus bradycardia, heart rate in the 40s at this time.  2.  Hypertension: Continue lisinopril. Hold metoprolol due to significant bradycardia at this time.  3.  Tobacco use disorder: Counseled for more than 3 minutes. Started on nicotine patch.  4.  Schizophrenia: Seems to be stable now. He is on fluphenazine injection every 2 weeks. Will continue that. 5.  Gastrointestinal and deep vein thrombosis prophylaxis with Protonix and subcutaneous heparin.   CODE  STATUS: Full code.   TIME SPENT ON ADMISSION: 50 minutes.   ____________________________ Enid Baasadhika Aylana Hirschfeld, MD rk:jcm D: 10/21/2013 13:36:11 ET T: 10/21/2013 14:33:21 ET JOB#: [id number]  cc: Enid Baasadhika Chalene Treu, MD, <Dictator> Serita ShellerErnest B. Maryellen PileEason, MD Enid BaasADHIKA Verner Kopischke MD ELECTRONICALLY SIGNED 11/17/2013 14:49

## 2015-02-26 NOTE — Discharge Summary (Signed)
PATIENT NAME:  Antonio Ford, Evertte MR#:  [id number] DATE OF BIRTH:  01/01/1951  DATE OF ADMISSION:  10/21/2013 DATE OF DISCHARGE:  10/23/2013  ADMITTING PHYSICIAN: Enid Baasadhika Lorieann Argueta, MD  DISCHARGING PHYSICIAN: Enid Baasadhika Gidget Quizhpi, MD  PRIMARY CARE PHYSICIAN: Dr. Maryellen PileEason.  CONSULTATIONS IN THE HOSPITAL: None.   DISCHARGE DIAGNOSES: 1.  Chest pain, likely musculoskeletal in nature.  2.  Coronary artery disease status post stent placement in the past.  3.  Hypothyroidism.  4.  Hypertension. 5.  Schizophrenia.  6.  Depression and anxiety.  7.  Gastroesophageal reflux disease.  8.  Down syndrome.   DISCHARGE HOME MEDICATIONS:  1.  Fluphenazine 0.6 mL injectable every 2 weeks.  2.  Lisinopril 10 mg p.o. daily. 3.  Nitroglycerin sublingual 0.4 mg p.r.n. for chest pain.  4.  Lipitor 10 mg p.o. daily.  5.  Omeprazole 20 mg p.o. daily.  6.  Aspirin 81 mg p.o. daily.  7.  Metoprolol succinate 50 mg p.o. daily.   DISCHARGE DIET: Low-sodium diet.   DISCHARGE ACTIVITY: As tolerated.  FOLLOWUP INSTRUCTIONS: 1.  PCP follow-up in 2 weeks.  2.  Cardiology follow-up in 4 weeks.   LABS AND IMAGING STUDIES PRIOR TO DISCHARGE: WBC is 6.6, hemoglobin 11.7, hematocrit 34.6, platelet count 314.   Sodium 141, potassium 4.6, chloride 111, bicarb 24, BUN 17, creatinine 1.12, glucose 87, and calcium 9. Troponins x3 remained negative.   Chest x-ray showing COPD changes. No evidence of cardiopulmonary disease and Myoview done showing myocardial perfusion study with no significant ischemia, no wall motion abnormality. EF is 68%. Normal global left ventricular systolic function.   BRIEF HOSPITAL COURSE: Antonio Ford is a 64 year old African American male with past medical history significant for depression and anxiety, schizophrenia, Down syndrome, hypertension, and coronary artery disease with prior stent who lives at home with his sister who was sent in from his physical by PCP secondary to chest pain. The patient  is a poor historian. He had some left-sided chest pain and that improved without any treatment. The next day he had a physical at the doctor's office and there he did mention the chest pain and they did an EKG which showed peaking T waves in the lateral leads from V3 to V5 so sent him to the ER.  1.  Chest pain. Could be musculoskeletal versus underlying angina, probably stable angina. However, he did not have any further chest pain while in the hospital. He had a Myoview done, which was completely normal, and EKG changes were deemed to be non-significant, per the cardiologist. Troponins remained negative while in the hospital, so he is being discharged home in a stable condition. All his other cardiac medications were continued. He is on aspirin, statin, lisinopril, and metoprolol. However, his metoprolol dose has been reduced from 100 to 50 mg as he was significantly bradycardic on admission.  2.  Hypertension. On metoprolol, reduced dose, lisinopril and also Norvasc.  3.  Schizophrenia and Down syndrome. Follows with psychiatrist in the community. He is a fluphenazine and is stable while in the hospital and lives at home with his sister.  His course has been otherwise uneventful in the hospital.   DISCHARGE CONDITION: Stable.   DISCHARGE DISPOSITION: Home.   TIME SPENT ON DISCHARGE: 45 minutes.   ____________________________ Enid Baasadhika Mattison Stuckey, MD rk:sb D: 10/23/2013 13:31:42 ET T: 10/23/2013 16:37:33 ET JOB#: [id number]  cc: Enid Baasadhika Julianna Vanwagner, MD, <Dictator> Serita ShellerErnest B. Maryellen PileEason, MD Enid BaasADHIKA Sibley Rolison MD ELECTRONICALLY SIGNED 11/11/2013 7:21

## 2015-11-11 ENCOUNTER — Encounter: Payer: Self-pay | Admitting: Internal Medicine

## 2015-11-11 ENCOUNTER — Emergency Department: Payer: Medicare Other

## 2015-11-11 ENCOUNTER — Inpatient Hospital Stay
Admission: EM | Admit: 2015-11-11 | Discharge: 2015-11-12 | DRG: 690 | Disposition: A | Discharge status: Home / Self Care | Payer: Medicare Other | Attending: Internal Medicine | Admitting: Internal Medicine

## 2015-11-11 DIAGNOSIS — N179 Acute kidney failure, unspecified: Secondary | ICD-10-CM | POA: Diagnosis present

## 2015-11-11 DIAGNOSIS — N400 Enlarged prostate without lower urinary tract symptoms: Secondary | ICD-10-CM

## 2015-11-11 DIAGNOSIS — R739 Hyperglycemia, unspecified: Secondary | ICD-10-CM | POA: Diagnosis present

## 2015-11-11 DIAGNOSIS — K219 Gastro-esophageal reflux disease without esophagitis: Secondary | ICD-10-CM | POA: Diagnosis present

## 2015-11-11 DIAGNOSIS — D649 Anemia, unspecified: Secondary | ICD-10-CM | POA: Diagnosis present

## 2015-11-11 DIAGNOSIS — E871 Hypo-osmolality and hyponatremia: Secondary | ICD-10-CM | POA: Diagnosis present

## 2015-11-11 DIAGNOSIS — I2510 Atherosclerotic heart disease of native coronary artery without angina pectoris: Secondary | ICD-10-CM | POA: Diagnosis present

## 2015-11-11 DIAGNOSIS — I248 Other forms of acute ischemic heart disease: Secondary | ICD-10-CM | POA: Diagnosis present

## 2015-11-11 DIAGNOSIS — N3001 Acute cystitis with hematuria: Principal | ICD-10-CM | POA: Diagnosis present

## 2015-11-11 DIAGNOSIS — E785 Hyperlipidemia, unspecified: Secondary | ICD-10-CM | POA: Diagnosis present

## 2015-11-11 DIAGNOSIS — Z7982 Long term (current) use of aspirin: Secondary | ICD-10-CM

## 2015-11-11 DIAGNOSIS — N189 Chronic kidney disease, unspecified: Secondary | ICD-10-CM | POA: Diagnosis present

## 2015-11-11 DIAGNOSIS — Z79899 Other long term (current) drug therapy: Secondary | ICD-10-CM

## 2015-11-11 DIAGNOSIS — R778 Other specified abnormalities of plasma proteins: Secondary | ICD-10-CM

## 2015-11-11 DIAGNOSIS — I129 Hypertensive chronic kidney disease with stage 1 through stage 4 chronic kidney disease, or unspecified chronic kidney disease: Secondary | ICD-10-CM | POA: Diagnosis present

## 2015-11-11 DIAGNOSIS — R7989 Other specified abnormal findings of blood chemistry: Secondary | ICD-10-CM

## 2015-11-11 DIAGNOSIS — F17210 Nicotine dependence, cigarettes, uncomplicated: Secondary | ICD-10-CM | POA: Diagnosis present

## 2015-11-11 DIAGNOSIS — N401 Enlarged prostate with lower urinary tract symptoms: Secondary | ICD-10-CM | POA: Diagnosis present

## 2015-11-11 DIAGNOSIS — R319 Hematuria, unspecified: Secondary | ICD-10-CM

## 2015-11-11 DIAGNOSIS — E860 Dehydration: Secondary | ICD-10-CM | POA: Diagnosis present

## 2015-11-11 DIAGNOSIS — N3091 Cystitis, unspecified with hematuria: Secondary | ICD-10-CM | POA: Diagnosis not present

## 2015-11-11 DIAGNOSIS — D72829 Elevated white blood cell count, unspecified: Secondary | ICD-10-CM

## 2015-11-11 DIAGNOSIS — I10 Essential (primary) hypertension: Secondary | ICD-10-CM | POA: Diagnosis present

## 2015-11-11 HISTORY — DX: Gastro-esophageal reflux disease without esophagitis: K21.9

## 2015-11-11 HISTORY — DX: Hyperlipidemia, unspecified: E78.5

## 2015-11-11 HISTORY — DX: Essential (primary) hypertension: I10

## 2015-11-11 HISTORY — DX: Atherosclerotic heart disease of native coronary artery without angina pectoris: I25.10

## 2015-11-11 LAB — URINALYSIS COMPLETE WITH MICROSCOPIC (ARMC ONLY)
BILIRUBIN URINE: NEGATIVE
GLUCOSE, UA: NEGATIVE mg/dL
KETONES UR: NEGATIVE mg/dL
NITRITE: NEGATIVE
PH: 6 (ref 5.0–8.0)
Protein, ur: 100 mg/dL — AB
Specific Gravity, Urine: 1.003 — ABNORMAL LOW (ref 1.005–1.030)

## 2015-11-11 LAB — COMPREHENSIVE METABOLIC PANEL
ALT: 26 U/L (ref 17–63)
ANION GAP: 12 (ref 5–15)
AST: 31 U/L (ref 15–41)
Albumin: 4.5 g/dL (ref 3.5–5.0)
Alkaline Phosphatase: 112 U/L (ref 38–126)
BUN: 34 mg/dL — ABNORMAL HIGH (ref 6–20)
CALCIUM: 9.7 mg/dL (ref 8.9–10.3)
CO2: 21 mmol/L — AB (ref 22–32)
Chloride: 98 mmol/L — ABNORMAL LOW (ref 101–111)
Creatinine, Ser: 3.39 mg/dL — ABNORMAL HIGH (ref 0.61–1.24)
GFR calc non Af Amer: 17 mL/min — ABNORMAL LOW (ref >60)
GFR, EST AFRICAN AMERICAN: 20 mL/min — AB (ref >60)
GLUCOSE: 100 mg/dL — AB (ref 65–99)
POTASSIUM: 3.7 mmol/L (ref 3.5–5.1)
SODIUM: 131 mmol/L — AB (ref 135–145)
TOTAL PROTEIN: 8.5 g/dL — AB (ref 6.5–8.1)
Total Bilirubin: 0.9 mg/dL (ref 0.3–1.2)

## 2015-11-11 LAB — CBC WITH DIFFERENTIAL/PLATELET
BASOS ABS: 0 10*3/uL (ref 0–0.1)
Basophils Relative: 0 %
EOS ABS: 0.2 10*3/uL (ref 0–0.7)
Eosinophils Relative: 1 %
HEMATOCRIT: 32.9 % — AB (ref 40.0–52.0)
HEMOGLOBIN: 11 g/dL — AB (ref 13.0–18.0)
LYMPHS PCT: 11 %
Lymphs Abs: 1.8 10*3/uL (ref 1.0–3.6)
MCH: 30.4 pg (ref 26.0–34.0)
MCHC: 33.3 g/dL (ref 32.0–36.0)
MCV: 91.2 fL (ref 80.0–100.0)
MONOS PCT: 8 %
Monocytes Absolute: 1.3 10*3/uL — ABNORMAL HIGH (ref 0.2–1.0)
NEUTROS ABS: 12.7 10*3/uL — AB (ref 1.4–6.5)
NEUTROS PCT: 80 %
Platelets: 251 10*3/uL (ref 150–440)
RBC: 3.6 MIL/uL — ABNORMAL LOW (ref 4.40–5.90)
RDW: 13.2 % (ref 11.5–14.5)
WBC: 16 10*3/uL — ABNORMAL HIGH (ref 3.8–10.6)

## 2015-11-11 LAB — LACTIC ACID, PLASMA: LACTIC ACID, VENOUS: 1.6 mmol/L (ref 0.5–2.0)

## 2015-11-11 LAB — LIPASE, BLOOD: LIPASE: 33 U/L (ref 11–51)

## 2015-11-11 LAB — TROPONIN I: TROPONIN I: 0.04 ng/mL — AB (ref <0.031)

## 2015-11-11 MED ORDER — SODIUM CHLORIDE 0.9 % IV BOLUS (SEPSIS)
1000.0000 mL | Freq: Once | INTRAVENOUS | Status: AC
  Administered 2015-11-11: 1000 mL via INTRAVENOUS

## 2015-11-11 MED ORDER — PIPERACILLIN-TAZOBACTAM 3.375 G IVPB
3.3750 g | Freq: Once | INTRAVENOUS | Status: AC
  Administered 2015-11-11: 3.375 g via INTRAVENOUS
  Filled 2015-11-11: qty 50

## 2015-11-11 MED ORDER — MORPHINE SULFATE (PF) 2 MG/ML IV SOLN
2.0000 mg | Freq: Once | INTRAVENOUS | Status: AC
  Administered 2015-11-11: 2 mg via INTRAVENOUS
  Filled 2015-11-11: qty 1

## 2015-11-11 MED ORDER — ONDANSETRON HCL 4 MG/2ML IJ SOLN
4.0000 mg | Freq: Once | INTRAMUSCULAR | Status: AC
  Administered 2015-11-11: 4 mg via INTRAVENOUS
  Filled 2015-11-11: qty 2

## 2015-11-11 NOTE — ED Notes (Signed)
Pt states bleeding from penis with urination that began today. Pt with normal color warm and dry skin. Pt states pain to pelvis.

## 2015-11-11 NOTE — ED Notes (Signed)
Spoke to Dr. Fanny BienQuale about patient being a potential sepsis patient. MD at bedside now.

## 2015-11-11 NOTE — ED Notes (Addendum)
Upon MD assessment patient told Dr. Fanny BienQuale he was hurting.

## 2015-11-11 NOTE — ED Notes (Signed)
Pt to room one, report to primary emma, rn

## 2015-11-11 NOTE — ED Provider Notes (Signed)
[facility] Emergency Department Provider Note  REMINDER - THIS NOTE IS NOT A FINAL MEDICAL RECORD UNTIL IT IS SIGNED. UNTIL THEN, THE CONTENT BELOW MAY REFLECT INFORMATION FROM A DOCUMENTATION TEMPLATE, NOT THE ACTUAL PATIENT VISIT. ____________________________________________  Time seen: Approximately 10:07 PM  I have reviewed the triage vital signs and the nursing notes.   HISTORY  Chief Complaint Hematuria    HPI Antonio Ford is a 65 y.o. male to previous history of heart disease. He reports that for about the last day he has had pain and burning with urination and pain across the lower pelvis. He has had some mild nausea but no vomiting. Denies chest pain or trouble breathing. No discharge from the penis. No pain in the groin. Denies being sexually active     Past Medical History  Diagnosis Date  . HTN (hypertension)   . HLD (hyperlipidemia)   . GERD (gastroesophageal reflux disease)   . CAD (coronary artery disease)     Patient Active Problem List   Diagnosis Date Noted  . Acute hemorrhagic cystitis 11/11/2015  . HTN (hypertension) 11/11/2015  . HLD (hyperlipidemia) 11/11/2015  . GERD (gastroesophageal reflux disease) 11/11/2015  . CAD (coronary artery disease) 11/11/2015  . AKI (acute kidney injury) (HCC) 11/11/2015    Past Surgical History  Procedure Laterality Date  . Hernia repair    . Cardiac catheterization      Current Outpatient Rx  Name  Route  Sig  Dispense  Refill  . aspirin EC 81 MG tablet   Oral   Take 81 mg by mouth daily.         Marland Kitchen atorvastatin (LIPITOR) 10 MG tablet   Oral   Take 10 mg by mouth daily.      5   . lisinopril (PRINIVIL,ZESTRIL) 10 MG tablet   Oral   Take 10 mg by mouth daily.      5   . metoprolol succinate (TOPROL-XL) 100 MG 24 hr tablet   Oral   Take 100 mg by mouth daily.      5   . omeprazole (PRILOSEC) 20 MG capsule   Oral   Take 20 mg by mouth daily.      5      Allergies Review of patient's allergies indicates no known allergies.  Family History  Problem Relation Age of Onset  . Diabetes Sister     Social History Social History  Substance Use Topics  . Smoking status: Current Every Day Smoker  . Smokeless tobacco: Not on file  . Alcohol Use: No    Review of Systems Constitutional: Some chills Eyes: No visual changes. ENT: No sore throat. Cardiovascular: Denies chest pain. Respiratory: Denies shortness of breath. Gastrointestinal:  No diarrhea.  No constipation. Genitourinary: See history of present illness Musculoskeletal: Negative for back pain. Skin: Negative for rash. Neurological: Negative for headaches, focal weakness or numbness.  10-point ROS otherwise negative.  ____________________________________________   PHYSICAL EXAM:  VITAL SIGNS: ED Triage Vitals  Enc Vitals Group     BP 11/11/15 2002 87/53 mmHg     Pulse Rate 11/11/15 2002 83     Resp 11/11/15 2002 16     Temp 11/11/15 2002 99 F (37.2 C)     Temp Source 11/11/15 2002 Oral     SpO2 11/11/15 2002 96 %     Weight 11/11/15 2002 125 lb (56.7 kg)     Height 11/11/15 2002 5\' 2"  (1.575 m)  Head Cir --      Peak Flow --      Pain Score --      Pain Loc --      Pain Edu? --      Excl. in GC? --    Constitutional: Alert and oriented. Well appearing and in no acute distress. Eyes: Conjunctivae are normal. PERRL. EOMI. Head: Atraumatic. Nose: No congestion/rhinnorhea. Mouth/Throat: Mucous membranes are moist.  Oropharynx non-erythematous. Neck: No stridor.   Cardiovascular: Normal rate, regular rhythm. Grossly normal heart sounds.  Good peripheral circulation. Respiratory: Normal respiratory effort.  No retractions. Lungs CTAB. Gastrointestinal: Soft and nontender except for some moderate discomfort suprapubically. No distention. No abdominal bruits. No CVA tenderness. Normal appearing penis. No testicular tenderness or erythema. No palpable groin  pain or mass. Musculoskeletal: No lower extremity tenderness nor edema.  No joint effusions. Neurologic:  Normal speech and language. No gross focal neurologic deficits are appreciated. No gait instability. Skin:  Skin is warm, dry and intact. No rash noted. Psychiatric: Mood and affect are normal. Speech and behavior are normal.  ____________________________________________   LABS (all labs ordered are listed, but only abnormal results are displayed)  Labs Reviewed  COMPREHENSIVE METABOLIC PANEL - Abnormal; Notable for the following:    Sodium 131 (*)    Chloride 98 (*)    CO2 21 (*)    Glucose, Bld 100 (*)    BUN 34 (*)    Creatinine, Ser 3.39 (*)    Total Protein 8.5 (*)    GFR calc non Af Amer 17 (*)    GFR calc Af Amer 20 (*)    All other components within normal limits  CBC WITH DIFFERENTIAL/PLATELET - Abnormal; Notable for the following:    WBC 16.0 (*)    RBC 3.60 (*)    Hemoglobin 11.0 (*)    HCT 32.9 (*)    Neutro Abs 12.7 (*)    Monocytes Absolute 1.3 (*)    All other components within normal limits  TROPONIN I - Abnormal; Notable for the following:    Troponin I 0.04 (*)    All other components within normal limits  URINALYSIS COMPLETEWITH MICROSCOPIC ([facility] ONLY) - Abnormal; Notable for the following:    Color, Urine YELLOW (*)    APPearance CLOUDY (*)    Specific Gravity, Urine 1.003 (*)    Hgb urine dipstick 3+ (*)    Protein, ur 100 (*)    Leukocytes, UA 3+ (*)    Bacteria, UA FEW (*)    Squamous Epithelial / LPF 0-5 (*)    All other components within normal limits  URINE CULTURE  CULTURE, BLOOD (ROUTINE X 2)  CULTURE, BLOOD (ROUTINE X 2)  LIPASE, BLOOD  LACTIC ACID, PLASMA  LACTIC ACID, PLASMA   ____________________________________________  EKG  Reviewed and interpreted by me at 2025 Normal sinus rhythm Probable significant left ventricular hypertrophy with repolarization abnormality. T-wave consistent with repolarization abnormality without  ischemic change PR 180 QTc 410 Heart rate 80 ____________________________________________  RADIOLOGY     DG Chest Port 1 View (Final result) Result time: 11/11/15 22:00:48   Final result by Rad Results In Interface (11/11/15 22:00:48)   Narrative:   CLINICAL DATA: Cough. History of hypertension.  EXAM: PORTABLE CHEST 1 VIEW  COMPARISON: 10/21/2013  FINDINGS: Cardiac silhouette is mildly enlarged. No mediastinal or hilar masses or evidence of adenopathy. Clear lungs. No pleural effusion or pneumothorax.  Bony thorax is intact.  IMPRESSION: No active disease.  Electronically Signed By: Amie Portlandavid Ormond M.D. On: 11/11/2015 22:00          CT RENAL STONE STUDY (Final result) Result time: 11/11/15 21:58:42   Final result by Rad Results In Interface (11/11/15 21:58:42)   Narrative:   CLINICAL DATA: Hematuria today. Pelvic region pain.  EXAM: CT ABDOMEN AND PELVIS WITHOUT CONTRAST  TECHNIQUE: Multidetector CT imaging of the abdomen and pelvis was performed following the standard protocol without IV contrast.  COMPARISON: 01/13/2013  FINDINGS: Lung bases: Minor subsegmental atelectasis. Heart normal in size.  Liver, spleen, gallbladder, pancreas, adrenal glands: Normal.  Kidneys, ureters, bladder: No renal masses. No stones. No hydronephrosis.  Prostate gland is enlarged. It indents the bladder base. Bladder wall is thickened. No discrete bladder mass is seen. No bladder stone. Prostate measures 5.9 x 5.2 x 5.7 cm. These findings are similar to the prior CT.  Lymph nodes: No pathologically enlarged lymph nodes.  Ascites: None.  Gastrointestinal: Unremarkable. Appendix not visualized. No evidence of appendicitis.  Musculoskeletal: Degenerative changes of the visualized spine, relatively mild. No osteoblastic or osteolytic lesions.  IMPRESSION: 1. No definite acute findings. No renal or ureteral stones. No obstructive uropathy. 2.  Significant prostate enlargement. Prostate indents the inferior bladder. Bladder wall is thickened. Findings are similar to the prior CT likely due to chronic bladder outlet obstruction from benign prostatic hypertrophy. Consider acute cystitis if there are consistent clinical symptoms.    ____________________________________________   PROCEDURES  Procedure(s) performed: None  Critical Care performed: Yes, see critical care note(s)  CRITICAL CARE Performed by: Sharyn CreamerQUALE, Torrence Branagan   Total critical care time: 35 minutes  Critical care time was exclusive of separately billable procedures and treating other patients.  Critical care was necessary to treat or prevent imminent or life-threatening deterioration.  Critical care was time spent personally by me on the following activities: development of treatment plan with patient and/or surrogate as well as nursing, discussions with consultants, evaluation of patient's response to treatment, examination of patient, obtaining history from patient or surrogate, ordering and performing treatments and interventions, ordering and review of laboratory studies, ordering and review of radiographic studies, pulse oximetry and re-evaluation of patient's condition.  ____________________________________________   INITIAL IMPRESSION / ASSESSMENT AND PLAN / ED COURSE  Pertinent labs & imaging results that were available during my care of the patient were reviewed by me and considered in my medical decision making (see chart for details).  Presents with dysuria, low-grade temperature at triage. Significant leukocytosis and lower abdominal tenderness. Troponin is +0.04, he does not have any acute cardiopulmonary symptoms. Does appear to be most consistent with left ventricular hypertrophy on EKG and I doubt acute coronary or ischemic disease at this point.  Leukocytosis, moderate hypotension and positive UTI with probable hemorrhagic cystitis based on my  assessment. CT does not indicate a stone or other acute intra-abdominal pathology. We will place the patient on broad-spectrum antibiotics given his presentation with hypotension and admit him to the hospital for ongoing management for concerns of urinary tract infection with sepsis.  ----------------------------------------- 11:36 PM on 11/11/2015 -----------------------------------------  Since blood pressures improving. Continues to do well. Admitted to the hospitalist service. Condition much improved. ____________________________________________   FINAL CLINICAL IMPRESSION(S) / ED DIAGNOSES  Final diagnoses:  Hematuria  Hemorrhagic cystitis  Acute renal failure, unspecified acute renal failure type (HCC)      Sharyn CreamerMark Jamaree Hosier, MD 11/11/15 2336

## 2015-11-11 NOTE — ED Notes (Addendum)
Patient transported to CT 

## 2015-11-11 NOTE — H&P (Addendum)
[facility] [place] [facility]   PATIENT NAME: Antonio Ford    MR#:  [id number]  DATE OF BIRTH:  Jul 31, 1951  DATE OF ADMISSION:  11/11/2015  PRIMARY CARE PHYSICIAN: Pcp Not In System   REQUESTING/REFERRING PHYSICIAN: Fanny Bien, MD  CHIEF COMPLAINT:   Chief Complaint  Patient presents with  . Hematuria    HISTORY OF PRESENT ILLNESS:  Antonio Ford  is a 65 y.o. male who presents with hematuria for one day. Patient states that the afternoon of presentation he began to have a little bit of dysuria, as well as hematuria. He states that initially he felt like he had frank blood in his urine. Afterwards this seemed to clear up a little. He came to the hospital for evaluation. He was found to have an elevated white blood cell count, too numerous to count red cells and white cells in his urine, and a CT scan which showed thickening of his bladder wall possibly consistent with acute cystitis. Hospitalists were called for admission for acute hemorrhagic cystitis.  PAST MEDICAL HISTORY:   Past Medical History  Diagnosis Date  . HTN (hypertension)   . HLD (hyperlipidemia)   . GERD (gastroesophageal reflux disease)   . CAD (coronary artery disease)     PAST SURGICAL HISTORY:   Past Surgical History  Procedure Laterality Date  . Hernia repair    . Cardiac catheterization      SOCIAL HISTORY:   Social History  Substance Use Topics  . Smoking status: Current Every Day Smoker  . Smokeless tobacco: Not on file  . Alcohol Use: No    FAMILY HISTORY:   Family History  Problem Relation Age of Onset  . Diabetes Sister     DRUG ALLERGIES:  No Known Allergies  MEDICATIONS AT HOME:   Prior to Admission medications   Medication Sig Start Date End Date Taking? Authorizing Provider  aspirin EC 81 MG tablet Take 81 mg by mouth daily.   Yes Historical Provider, MD  atorvastatin (LIPITOR) 10 MG tablet Take 10 mg by mouth daily. 11/04/15  Yes Historical  Provider, MD  lisinopril (PRINIVIL,ZESTRIL) 10 MG tablet Take 10 mg by mouth daily. 10/31/15  Yes Historical Provider, MD  metoprolol succinate (TOPROL-XL) 100 MG 24 hr tablet Take 100 mg by mouth daily. 10/27/15  Yes Historical Provider, MD  omeprazole (PRILOSEC) 20 MG capsule Take 20 mg by mouth daily. 10/31/15  Yes Historical Provider, MD    REVIEW OF SYSTEMS:  Review of Systems  Constitutional: Negative for fever, chills, weight loss and malaise/fatigue.  HENT: Negative for ear pain, hearing loss and tinnitus.   Eyes: Negative for blurred vision, double vision, pain and redness.  Respiratory: Negative for cough, hemoptysis and shortness of breath.   Cardiovascular: Negative for chest pain, palpitations, orthopnea and leg swelling.  Gastrointestinal: Negative for nausea, vomiting, abdominal pain, diarrhea and constipation.  Genitourinary: Positive for dysuria and hematuria. Negative for frequency.  Musculoskeletal: Negative for back pain, joint pain and neck pain.  Skin:       No acne, rash, or lesions  Neurological: Negative for dizziness, tremors, focal weakness and weakness.  Endo/Heme/Allergies: Negative for polydipsia. Does not bruise/bleed easily.  Psychiatric/Behavioral: Negative for depression. The patient is not nervous/anxious and does not have insomnia.      VITAL SIGNS:   Filed Vitals:   11/11/15 2026 11/11/15 2106 11/11/15 2130 11/11/15 2217  BP: 91/56 110/51 103/60 100/45  Pulse: 81 77 72 82  Temp: 98.1  F (36.7 C) 97.9 F (36.6 C)    TempSrc: Oral Oral    Resp: 18 16 18 18   Height:      Weight:      SpO2: 98% 95% 100% 100%   Wt Readings from Last 3 Encounters:  11/11/15 56.7 kg (125 lb)    PHYSICAL EXAMINATION:  Physical Exam  Vitals reviewed. Constitutional: He is oriented to person, place, and time. He appears well-developed and well-nourished. No distress.  HENT:  Head: Normocephalic and atraumatic.  Mouth/Throat: Oropharynx is clear and moist.   Eyes: Conjunctivae and EOM are normal. Pupils are equal, round, and reactive to light. No scleral icterus.  Neck: Normal range of motion. Neck supple. No JVD present. No thyromegaly present.  Cardiovascular: Normal rate, regular rhythm and intact distal pulses.  Exam reveals no gallop and no friction rub.   No murmur heard. Respiratory: Effort normal and breath sounds normal. No respiratory distress. He has no wheezes. He has no rales.  GI: Soft. Bowel sounds are normal. He exhibits no distension. There is no tenderness.  Musculoskeletal: Normal range of motion. He exhibits no edema.  No arthritis, no gout  Lymphadenopathy:    He has no cervical adenopathy.  Neurological: He is alert and oriented to person, place, and time. No cranial nerve deficit.  No dysarthria, no aphasia  Skin: Skin is warm and dry. No rash noted. No erythema.  Psychiatric: He has a normal mood and affect. His behavior is normal. Judgment and thought content normal.    LABORATORY PANEL:   CBC  Recent Labs Lab 11/11/15 2052  WBC 16.0*  HGB 11.0*  HCT 32.9*  PLT 251   ------------------------------------------------------------------------------------------------------------------  Chemistries   Recent Labs Lab 11/11/15 2052  NA 131*  K 3.7  CL 98*  CO2 21*  GLUCOSE 100*  BUN 34*  CREATININE 3.39*  CALCIUM 9.7  AST 31  ALT 26  ALKPHOS 112  BILITOT 0.9   ------------------------------------------------------------------------------------------------------------------  Cardiac Enzymes  Recent Labs Lab 11/11/15 2045  TROPONINI 0.04*   ------------------------------------------------------------------------------------------------------------------  RADIOLOGY:  Dg Chest Port 1 View  11/11/2015  CLINICAL DATA:  Cough.  History of hypertension. EXAM: PORTABLE CHEST 1 VIEW COMPARISON:  10/21/2013 FINDINGS: Cardiac silhouette is mildly enlarged. No mediastinal or hilar masses or evidence of  adenopathy. Clear lungs. No pleural effusion or pneumothorax. Bony thorax is intact. IMPRESSION: No active disease. Electronically Signed   By: Amie Portlandavid  Ormond M.D.   On: 11/11/2015 22:00   Ct Renal Stone Study  11/11/2015  CLINICAL DATA:  Hematuria today.  Pelvic region pain. EXAM: CT ABDOMEN AND PELVIS WITHOUT CONTRAST TECHNIQUE: Multidetector CT imaging of the abdomen and pelvis was performed following the standard protocol without IV contrast. COMPARISON:  01/13/2013 FINDINGS: Lung bases:  Minor subsegmental atelectasis.  Heart normal in size. Liver, spleen, gallbladder, pancreas, adrenal glands:  Normal. Kidneys, ureters, bladder: No renal masses. No stones. No hydronephrosis. Prostate gland is enlarged. It indents the bladder base. Bladder wall is thickened. No discrete bladder mass is seen. No bladder stone. Prostate measures 5.9 x 5.2 x 5.7 cm. These findings are similar to the prior CT. Lymph nodes:  No pathologically enlarged lymph nodes. Ascites:  None. Gastrointestinal: Unremarkable. Appendix not visualized. No evidence of appendicitis. Musculoskeletal: Degenerative changes of the visualized spine, relatively mild. No osteoblastic or osteolytic lesions. IMPRESSION: 1. No definite acute findings. No renal or ureteral stones. No obstructive uropathy. 2. Significant prostate enlargement. Prostate indents the inferior bladder. Bladder wall is thickened. Findings  are similar to the prior CT likely due to chronic bladder outlet obstruction from benign prostatic hypertrophy. Consider acute cystitis if there are consistent clinical symptoms. Electronically Signed   By: Amie Portland M.D.   On: 11/11/2015 21:58    EKG:   Orders placed or performed during the hospital encounter of 11/11/15  . ED EKG  . ED EKG  . ED EKG  . ED EKG  . EKG 12-Lead  . EKG 12-Lead    IMPRESSION AND PLAN:  Principal Problem:   Acute hemorrhagic cystitis - patient also had BPH noted on CT scan. This is potentially a  precipitating factor for an infectious cystitis, which turned hemorrhagic. Given his elevated white blood cell count, patient was started on IV antibiotics. Urology consult placed. Urine culture sent, as well as blood cultures from the ED. Active Problems:   AKI (acute kidney injury) - baseline creatinine around 1, elevated to greater than 3 today. We'll give fluids, treat hemorrhagic cystitis as above, and monitor closely.   HTN (hypertension) - blood pressure is borderline low in the ED. Hold his home antihypertensives for now. He will need this restarted once his blood pressure improves. Fluids for blood pressure support.   CAD (coronary artery disease) - continue home meds, except for those which might lower his blood pressure.   HLD (hyperlipidemia) - continue home statin   GERD (gastroesophageal reflux disease) - equivalent home dose PPI  All the records are reviewed and case discussed with ED provider. Management plans discussed with the patient and/or family.  DVT PROPHYLAXIS: mechanical only  GI PROPHYLAXIS: PPI  ADMISSION STATUS: Inpatient  CODE STATUS: Full  TOTAL TIME TAKING CARE OF THIS PATIENT: 45 minutes.    Jojuan Champney FIELDING 11/11/2015, 10:42 PM  [facility]  Office  [phone]  CC: Primary care physician; Pcp Not In System

## 2015-11-12 ENCOUNTER — Inpatient Hospital Stay
Admit: 2015-11-12 | Discharge: 2015-11-12 | Disposition: A | Discharge status: Home / Self Care | Payer: Medicare Other | Attending: Internal Medicine | Admitting: Internal Medicine

## 2015-11-12 DIAGNOSIS — N400 Enlarged prostate without lower urinary tract symptoms: Secondary | ICD-10-CM

## 2015-11-12 DIAGNOSIS — R7989 Other specified abnormal findings of blood chemistry: Secondary | ICD-10-CM

## 2015-11-12 DIAGNOSIS — D72829 Elevated white blood cell count, unspecified: Secondary | ICD-10-CM

## 2015-11-12 DIAGNOSIS — E871 Hypo-osmolality and hyponatremia: Secondary | ICD-10-CM

## 2015-11-12 DIAGNOSIS — R778 Other specified abnormalities of plasma proteins: Secondary | ICD-10-CM

## 2015-11-12 DIAGNOSIS — E860 Dehydration: Secondary | ICD-10-CM

## 2015-11-12 LAB — BASIC METABOLIC PANEL
Anion gap: 6 (ref 5–15)
BUN: 30 mg/dL — AB (ref 6–20)
CO2: 20 mmol/L — AB (ref 22–32)
Calcium: 8.3 mg/dL — ABNORMAL LOW (ref 8.9–10.3)
Chloride: 110 mmol/L (ref 101–111)
Creatinine, Ser: 2.51 mg/dL — ABNORMAL HIGH (ref 0.61–1.24)
GFR calc Af Amer: 29 mL/min — ABNORMAL LOW (ref >60)
GFR, EST NON AFRICAN AMERICAN: 25 mL/min — AB (ref >60)
GLUCOSE: 128 mg/dL — AB (ref 65–99)
POTASSIUM: 3.6 mmol/L (ref 3.5–5.1)
Sodium: 136 mmol/L (ref 135–145)

## 2015-11-12 LAB — CREATININE, SERUM
CREATININE: 2.01 mg/dL — AB (ref 0.61–1.24)
GFR calc Af Amer: 38 mL/min — ABNORMAL LOW (ref >60)
GFR calc non Af Amer: 33 mL/min — ABNORMAL LOW (ref >60)

## 2015-11-12 LAB — CBC
HEMATOCRIT: 29.7 % — AB (ref 40.0–52.0)
Hemoglobin: 10 g/dL — ABNORMAL LOW (ref 13.0–18.0)
MCH: 30.5 pg (ref 26.0–34.0)
MCHC: 33.6 g/dL (ref 32.0–36.0)
MCV: 91 fL (ref 80.0–100.0)
Platelets: 208 10*3/uL (ref 150–440)
RBC: 3.27 MIL/uL — ABNORMAL LOW (ref 4.40–5.90)
RDW: 13.1 % (ref 11.5–14.5)
WBC: 10.3 10*3/uL (ref 3.8–10.6)

## 2015-11-12 LAB — LACTIC ACID, PLASMA: LACTIC ACID, VENOUS: 1 mmol/L (ref 0.5–2.0)

## 2015-11-12 LAB — HEMOGLOBIN A1C: Hgb A1c MFr Bld: 5.9 % (ref 4.0–6.0)

## 2015-11-12 MED ORDER — ONDANSETRON HCL 4 MG/2ML IJ SOLN: 4.0000 mg | Freq: Four times a day (QID) | INTRAMUSCULAR | Status: DC | PRN

## 2015-11-12 MED ORDER — ASPIRIN EC 81 MG PO TBEC
81.0000 mg | DELAYED_RELEASE_TABLET | Freq: Every day | ORAL | Status: DC
Start: 2015-11-12 — End: 2015-11-12
  Administered 2015-11-12: 81 mg via ORAL
  Filled 2015-11-12: qty 1

## 2015-11-12 MED ORDER — ATORVASTATIN CALCIUM 10 MG PO TABS
10.0000 mg | ORAL_TABLET | Freq: Every day | ORAL | Status: DC
  Administered 2015-11-12: 10 mg via ORAL
  Filled 2015-11-12: qty 1

## 2015-11-12 MED ORDER — DEXTROSE 5 % IV SOLN
1.0000 g | Freq: Every day | INTRAVENOUS | Status: DC
  Administered 2015-11-12: 1 g via INTRAVENOUS
  Filled 2015-11-12 (×2): qty 10

## 2015-11-12 MED ORDER — TAMSULOSIN HCL 0.4 MG PO CAPS: 0.4000 mg | ORAL_CAPSULE | Freq: Every day | ORAL | Status: AC

## 2015-11-12 MED ORDER — OXYBUTYNIN CHLORIDE 5 MG PO TABS: 5.0000 mg | ORAL_TABLET | Freq: Three times a day (TID) | ORAL | Status: DC | PRN

## 2015-11-12 MED ORDER — PHENAZOPYRIDINE HCL 100 MG PO TABS: 100.0000 mg | ORAL_TABLET | Freq: Three times a day (TID) | ORAL | Status: AC

## 2015-11-12 MED ORDER — ONDANSETRON HCL 4 MG PO TABS: 4.0000 mg | ORAL_TABLET | Freq: Four times a day (QID) | ORAL | Status: DC | PRN

## 2015-11-12 MED ORDER — ACETAMINOPHEN 325 MG PO TABS: 650.0000 mg | ORAL_TABLET | Freq: Four times a day (QID) | ORAL | Status: DC | PRN

## 2015-11-12 MED ORDER — CEPHALEXIN 500 MG PO CAPS: 500.0000 mg | ORAL_CAPSULE | Freq: Two times a day (BID) | ORAL | Status: DC

## 2015-11-12 MED ORDER — PHENAZOPYRIDINE HCL 100 MG PO TABS: 100.0000 mg | ORAL_TABLET | Freq: Three times a day (TID) | ORAL | Status: DC

## 2015-11-12 MED ORDER — SODIUM CHLORIDE 0.9 % IV SOLN
INTRAVENOUS | Status: AC
  Administered 2015-11-12: 03:00:00 via INTRAVENOUS

## 2015-11-12 MED ORDER — PANTOPRAZOLE SODIUM 40 MG PO TBEC
40.0000 mg | DELAYED_RELEASE_TABLET | Freq: Every day | ORAL | Status: DC
  Administered 2015-11-12: 40 mg via ORAL
  Filled 2015-11-12: qty 1

## 2015-11-12 MED ORDER — MORPHINE SULFATE (PF) 2 MG/ML IV SOLN: 2.0000 mg | INTRAVENOUS | Status: DC | PRN

## 2015-11-12 MED ORDER — ACETAMINOPHEN 650 MG RE SUPP: 650.0000 mg | Freq: Four times a day (QID) | RECTAL | Status: DC | PRN

## 2015-11-12 MED ORDER — TAMSULOSIN HCL 0.4 MG PO CAPS
0.4000 mg | ORAL_CAPSULE | Freq: Every day | ORAL | Status: DC
  Administered 2015-11-12: 0.4 mg via ORAL
  Filled 2015-11-12: qty 1

## 2015-11-12 NOTE — Plan of Care (Signed)
Problem: Pain Managment: Goal: General experience of comfort will improve Outcome: Completed/Met Date Met:  11/12/15 No complaints of pain this shift.  Problem: Skin Integrity: Goal: Risk for impaired skin integrity will decrease Outcome: Progressing Skin wdl

## 2015-11-12 NOTE — Evaluation (Signed)
Physical Therapy Evaluation Patient Details Name: Antonio Ford MRN: [id number] DOB: Antonio Ford, Antonio Ford Today's Date: 11/12/2015   History of Present Illness  Pt here with hematuria, some groin pain.    Clinical Impression  Pt shows good effort and confidence with ambulation, stair negotiation, etc.  He shows appropriate strength and mobility with all acts and displays no safety, balance or other issues regarding PT.  Pt is safe to return home w/o further PT intervention.     Follow Up Recommendations No PT follow up    Equipment Recommendations       Recommendations for Other Services       Precautions / Restrictions Restrictions Weight Bearing Restrictions: No      Mobility  Bed Mobility Overal bed mobility: Independent             General bed mobility comments: No issues getting to EOB  Transfers Overall transfer level: Independent Equipment used: None             General transfer comment: Pt able to rise confidently and w/o the use of UEs  Ambulation/Gait Ambulation/Gait assistance: Independent Ambulation Distance (Feet): 250 Feet Assistive device: None       General Gait Details: Pt walks with great speed and confidence.  He has no safety or balance concerns and ultimately is at his community appropriate baseline.   Stairs Stairs: Yes Stairs assistance: Independent Stair Management: No rails Number of Stairs: 5 General stair comments: Pt safe and confident negotiating steps  Wheelchair Mobility    Modified Rankin (Stroke Patients Only)       Balance                                             Pertinent Vitals/Pain Pain Assessment:  (minimal groin pain)    Home Living Family/patient expects to be discharged to:: Private residence Living Arrangements: Other relatives Available Help at Discharge: Family   Home Access: Stairs to enter Entrance Stairs-Rails: Can reach both Entrance Stairs-Number of Steps: 5           Prior Function Level of Independence: Independent         Comments: Pt able to walk and be active w/o issue     Hand Dominance        Extremity/Trunk Assessment   Upper Extremity Assessment: Overall WFL for tasks assessed           Lower Extremity Assessment: Overall WFL for tasks assessed         Communication   Communication: No difficulties  Cognition Arousal/Alertness: Awake/alert Behavior During Therapy: WFL for tasks assessed/performed Overall Cognitive Status: Within Functional Limits for tasks assessed                      General Comments      Exercises        Assessment/Plan    PT Assessment Patent does not need any further PT services  PT Diagnosis Difficulty walking   PT Problem List    PT Treatment Interventions     PT Goals (Current goals can be found in the Care Plan section) Acute Rehab PT Goals Patient Stated Goal: go home    Frequency     Barriers to discharge        Co-evaluation  End of Session Equipment Utilized During Treatment: Gait belt Activity Tolerance: Patient tolerated treatment well Patient left: with bed alarm set;with call bell/phone within reach Nurse Communication: Mobility status         Time: 0454-0981 PT Time Calculation (min) (ACUTE ONLY): 12 min   Charges:   PT Evaluation $PT Eval Low Complexity: 1 Procedure     PT G Codes:       Loran Senters, PT, DPT [phone]  Malachi Pro 11/12/2015, 3:33 PM

## 2015-11-12 NOTE — Progress Notes (Signed)
ANTIBIOTIC CONSULT NOTE - INITIAL  Pharmacy Consult for ceftriaxone Indication: UTI  No Known Allergies  Patient Measurements: Height: 5\' 2"  (157.5 cm) Weight: 125 lb (56.7 kg) IBW/kg (Calculated) : 54.6 Adjusted Body Weight:   Vital Signs: Temp: 97.9 F (36.6 C) (01/06 2106) Temp Source: Oral (01/06 2106) BP: 142/51 mmHg (01/07 0130) Pulse Rate: 71 (01/07 0100) Intake/Output from previous day: 01/06 0701 - 01/07 0700 In: 2050 [I.V.:2000; IV Piggyback:50] Out: 600 [Urine:600] Intake/Output from this shift: Total I/O In: 2050 [I.V.:2000; IV Piggyback:50] Out: 600 [Urine:600]  Labs:  Recent Labs  11/11/15 2052  WBC 16.0*  HGB 11.0*  PLT 251  CREATININE 3.39*   Estimated Creatinine Clearance: 16.6 mL/min (by C-G formula based on Cr of 3.39). No results for input(s): VANCOTROUGH, VANCOPEAK, VANCORANDOM, GENTTROUGH, GENTPEAK, GENTRANDOM, TOBRATROUGH, TOBRAPEAK, TOBRARND, AMIKACINPEAK, AMIKACINTROU, AMIKACIN in the last 72 hours.   Microbiology: No results found for this or any previous visit (from the past 720 hour(s)).  Medical History: Past Medical History  Diagnosis Date  . HTN (hypertension)   . HLD (hyperlipidemia)   . GERD (gastroesophageal reflux disease)   . CAD (coronary artery disease)     Medications:  Infusions:  . sodium chloride     Assessment: 64 yom cc hematuria/dysuria. Elevated WBC TNTC RBC and WBC in urine CT shows thickening consistent with acute cystitis, pharmacy consulted to dose ceftriaxone for UTI.  Goal of Therapy:   Plan:  Ceftriaxone 1 gm IV Q24H. Pharmacy will continue to follow.   Carola FrostNathan A Cesar Rogerson, Pharm.D., BCPS Clinical Pharmacist 11/12/2015,2:18 AM

## 2015-11-12 NOTE — Discharge Summary (Signed)
[facility] [place] at [facility]   PATIENT NAME: Zaivion Kundrat    MR#:  [id number]  DATE OF BIRTH:  1951-01-03  DATE OF ADMISSION:  11/11/2015 ADMITTING PHYSICIAN: Oralia Manis, MD  DATE OF DISCHARGE: No discharge date for patient encounter.  PRIMARY CARE PHYSICIAN: Pcp Not In System     ADMISSION DIAGNOSIS:  Hemorrhagic cystitis [N30.91] Hematuria [R31.9] Acute renal failure, unspecified acute renal failure type (HCC) [N17.9]  DISCHARGE DIAGNOSIS:  Principal Problem:   Acute hemorrhagic cystitis Active Problems:   AKI (acute kidney injury) (HCC)   Elevated troponin   Hyponatremia   Dehydration   BPH (benign prostatic hyperplasia)   HTN (hypertension)   HLD (hyperlipidemia)   GERD (gastroesophageal reflux disease)   CAD (coronary artery disease)   Leukocytosis   SECONDARY DIAGNOSIS:   Past Medical History  Diagnosis Date  . HTN (hypertension)   . HLD (hyperlipidemia)   . GERD (gastroesophageal reflux disease)   . CAD (coronary artery disease)     .pro HOSPITAL COURSE:   The patient is a 65 year old African-American male with medical history significant for history of hypertension, hyperlipidemia, gastroesophageal reflux disease as well as coronary artery disease who presents to the hospital with hematuria. On arrival to the emergency room he was found to have elevated white blood cell count. Urinalysis revealed too numerous to count white blood cells too numerous to count red blood cells. Labs were also concerning for acute on chronic renal failure with creatinine level of 3.39 on admission and sodium level of 131. Patient was initiated on IV fluids and his kidney function as well as sodium level improved. He was also initiated on broad-spectrum antibiotic therapy for urinary tract infection and hematuria cleared. It was felt that patient should continue antibiotic therapy to complete a course and follow up with urologist as outpatient .  Patient did have CT of abdomen and pelvis for renal stone study revealing significant prostate enlargement and thickened bladder wall, findings were concerning for chronic bladder outlet obstruction from benign prostate hypertrophy. Patient was initiated on Flomax. Patient was evaluated by physical therapist and recommended no home health or outpatient physical therapy follow-up Discussion by problem 1. Acute on chronic renal failure, likely postobstructive uropathy as well as due to dehydration, improving with IV fluids, follow creatinine level and if it is at baseline. Likely discharge patient home , bladder scan is ordered, pending,  2  . Acute cystitis with hematuria , improving with antibiotic therapy. Continue antibiotics to complete course. Follow-up with urologist as outpatient  3. Acute posthemorrhagic anemia , no significant hemoglobin drop with rehydration , follow as outpatient and initiate iron supplementation if needed  4. Leukocytosis , improved with antibiotic therapy  5. Elevated troponin, likely demand ischemia , patient may benefit from cardiology follow-up as outpatient , who was ordered, pending 6. Hyperglycemia, getting hemoglobin A1c to rule out diabetes, pending 7. Generalized weakness, patient was evaluated by physical therapist and now needs were found, discussed with social worker  8. BPH , initiate Flomax  9. Dehydration , improved with IV fluids  DISCHARGE CONDITIONS:  Stable   Treatment Team:  Malen Gauze, MD  DRUG ALLERGIES:  No Known Allergies  DISCHARGE MEDICATIONS:   Current Discharge Medication List    START taking these medications   Details  cephALEXin (KEFLEX) 500 MG capsule Take 1 capsule (500 mg total) by mouth 2 (two) times daily. Qty: 20 capsule, Refills: 0    oxybutynin (DITROPAN) 5 MG  tablet Take 1 tablet (5 mg total) by mouth every 8 (eight) hours as needed for bladder spasms. Qty: 30 tablet, Refills: 0    phenazopyridine (PYRIDIUM)  100 MG tablet Take 1 tablet (100 mg total) by mouth 3 (three) times daily with meals. Qty: 10 tablet, Refills: 0    tamsulosin (FLOMAX) 0.4 MG CAPS capsule Take 1 capsule (0.4 mg total) by mouth daily. Qty: 30 capsule, Refills: 5      CONTINUE these medications which have NOT CHANGED   Details  atorvastatin (LIPITOR) 10 MG tablet Take 10 mg by mouth daily. Refills: 5    metoprolol succinate (TOPROL-XL) 100 MG 24 hr tablet Take 100 mg by mouth daily. Refills: 5    omeprazole (PRILOSEC) 20 MG capsule Take 20 mg by mouth daily. Refills: 5      STOP taking these medications     aspirin EC 81 MG tablet      lisinopril (PRINIVIL,ZESTRIL) 10 MG tablet          DISCHARGE INSTRUCTIONS:    patient is to follow-up with primary care physician, urologist, cardiologist as outpatient  If you experience worsening of your admission symptoms, develop shortness of breath, life threatening emergency, suicidal or homicidal thoughts you must seek medical attention immediately by calling 911 or calling your MD immediately  if symptoms less severe.  You Must read complete instructions/literature along with all the possible adverse reactions/side effects for all the Medicines you take and that have been prescribed to you. Take any new Medicines after you have completely understood and accept all the possible adverse reactions/side effects.   Please note  You were cared for by a hospitalist during your hospital stay. If you have any questions about your discharge medications or the care you received while you were in the hospital after you are discharged, you can call the unit and asked to speak with the hospitalist on call if the hospitalist that took care of you is not available. Once you are discharged, your primary care physician will handle any further medical issues. Please note that NO REFILLS for any discharge medications will be authorized once you are discharged, as it is imperative that you  return to your primary care physician (or establish a relationship with a primary care physician if you do not have one) for your aftercare needs so that they can reassess your need for medications and monitor your lab values.    Today   CHIEF COMPLAINT:   Chief Complaint  Patient presents with  . Hematuria    HISTORY OF PRESENT ILLNESS:  Zebulan Hinshaw  is a 65 y.o. male with a known history of hypertension, hyperlipidemia, gastroesophageal reflux disease as well as coronary artery disease who presents to the hospital with hematuria. On arrival to the emergency room he was found to have elevated white blood cell count. Urinalysis revealed too numerous to count white blood cells too numerous to count red blood cells. Labs were also concerning for acute on chronic renal failure with creatinine level of 3.39 on admission and sodium level of 131. Patient was initiated on IV fluids and his kidney function as well as sodium level improved. He was also initiated on broad-spectrum antibiotic therapy for urinary tract infection and hematuria cleared. It was felt that patient should continue antibiotic therapy to complete a course and follow up with urologist as outpatient . Patient did have CT of abdomen and pelvis for renal stone study revealing significant prostate enlargement and thickened bladder wall,  findings were concerning for chronic bladder outlet obstruction from benign prostate hypertrophy. Patient was initiated on Flomax. Patient was evaluated by physical therapist and recommended no home health or outpatient physical therapy follow-up Discussion by problem 1. Acute on chronic renal failure, likely postobstructive uropathy as well as due to dehydration, improving with IV fluids, follow creatinine level and if it is at baseline. Likely discharge patient home , bladder scan is ordered, pending,  2  . Acute cystitis with hematuria , improving with antibiotic therapy. Continue antibiotics to complete  course. Follow-up with urologist as outpatient  3. Acute posthemorrhagic anemia , no significant hemoglobin drop with rehydration , follow as outpatient and initiate iron supplementation if needed  4. Leukocytosis , improved with antibiotic therapy  5. Elevated troponin, likely demand ischemia , patient may benefit from cardiology follow-up as outpatient , who was ordered, pending 6. Hyperglycemia, getting hemoglobin A1c to rule out diabetes, pending 7. Generalized weakness, patient was evaluated by physical therapist and now needs were found, discussed with social worker  8. BPH , initiate Flomax  9. Dehydration , improved with IV fluids     VITAL SIGNS:  Blood pressure 104/62, pulse 85, temperature 98 F (36.7 C), temperature source Oral, resp. rate 18, height 5\' 2"  (1.575 m), weight 53.298 kg (117 lb 8 oz), SpO2 100 %.  I/O:   Intake/Output Summary (Last 24 hours) at 11/12/15 1320 Last data filed at 11/12/15 1123  Gross per 24 hour  Intake 2401.25 ml  Output   2025 ml  Net 376.25 ml    PHYSICAL EXAMINATION:  GENERAL:  65 y.o.-year-old patient lying in the bed with no acute distress.  EYES: Pupils equal, round, reactive to light and accommodation. No scleral icterus. Extraocular muscles intact.  HEENT: Head atraumatic, normocephalic. Oropharynx and nasopharynx clear.  NECK:  Supple, no jugular venous distention. No thyroid enlargement, no tenderness.  LUNGS: Normal breath sounds bilaterally, no wheezing, rales,rhonchi or crepitation. No use of accessory muscles of respiration.  CARDIOVASCULAR: S1, S2 normal. No murmurs, rubs, or gallops.  ABDOMEN: Soft, non-tender, non-distended. Bowel sounds present. No organomegaly or mass.  EXTREMITIES: No pedal edema, cyanosis, or clubbing.  NEUROLOGIC: Cranial nerves II through XII are intact. Muscle strength 5/5 in all extremities. Sensation intact. Gait not checked.  PSYCHIATRIC: The patient is alert and oriented x 3.  SKIN: No obvious  rash, lesion, or ulcer.  clear urine in urinal   DATA REVIEW:   CBC  Recent Labs Lab 11/12/15 0325  WBC 10.3  HGB 10.0*  HCT 29.7*  PLT 208    Chemistries   Recent Labs Lab 11/11/15 2052 11/12/15 0325  NA 131* 136  K 3.7 3.6  CL 98* 110  CO2 21* 20*  GLUCOSE 100* 128*  BUN 34* 30*  CREATININE 3.39* 2.51*  CALCIUM 9.7 8.3*  AST 31  --   ALT 26  --   ALKPHOS 112  --   BILITOT 0.9  --     Cardiac Enzymes  Recent Labs Lab 11/11/15 2045  TROPONINI 0.04*    Microbiology Results  Results for orders placed or performed during the hospital encounter of 11/11/15  Urine culture     Status: None (Preliminary result)   Collection Time: 11/11/15  8:06 PM  Result Value Ref Range Status   Specimen Description URINE, RANDOM  Final   Special Requests NONE  Final   Culture   Final    >=100,000 COLONIES/mL ESCHERICHIA COLI SUSCEPTIBILITIES TO FOLLOW  Report Status PENDING  Incomplete  Culture, blood (Routine X 2) w Reflex to ID Panel     Status: None (Preliminary result)   Collection Time: 11/11/15  8:53 PM  Result Value Ref Range Status   Specimen Description BLOOD LEFT FACE  Final   Special Requests BOTTLES DRAWN AEROBIC AND ANAEROBIC 4CC  Final   Culture NO GROWTH < 12 HOURS  Final   Report Status PENDING  Incomplete  Culture, blood (Routine X 2) w Reflex to ID Panel     Status: None (Preliminary result)   Collection Time: 11/11/15  8:53 PM  Result Value Ref Range Status   Specimen Description BLOOD RIGHT FACE  Final   Special Requests BOTTLES DRAWN AEROBIC AND ANAEROBIC 4CC  Final   Culture NO GROWTH < 12 HOURS  Final   Report Status PENDING  Incomplete    RADIOLOGY:  Dg Chest Port 1 View  11/11/2015  CLINICAL DATA:  Cough.  History of hypertension. EXAM: PORTABLE CHEST 1 VIEW COMPARISON:  10/21/2013 FINDINGS: Cardiac silhouette is mildly enlarged. No mediastinal or hilar masses or evidence of adenopathy. Clear lungs. No pleural effusion or pneumothorax. Bony  thorax is intact. IMPRESSION: No active disease. Electronically Signed   By: Amie Portlandavid  Ormond M.D.   On: 11/11/2015 22:00   Ct Renal Stone Study  11/11/2015  CLINICAL DATA:  Hematuria today.  Pelvic region pain. EXAM: CT ABDOMEN AND PELVIS WITHOUT CONTRAST TECHNIQUE: Multidetector CT imaging of the abdomen and pelvis was performed following the standard protocol without IV contrast. COMPARISON:  01/13/2013 FINDINGS: Lung bases:  Minor subsegmental atelectasis.  Heart normal in size. Liver, spleen, gallbladder, pancreas, adrenal glands:  Normal. Kidneys, ureters, bladder: No renal masses. No stones. No hydronephrosis. Prostate gland is enlarged. It indents the bladder base. Bladder wall is thickened. No discrete bladder mass is seen. No bladder stone. Prostate measures 5.9 x 5.2 x 5.7 cm. These findings are similar to the prior CT. Lymph nodes:  No pathologically enlarged lymph nodes. Ascites:  None. Gastrointestinal: Unremarkable. Appendix not visualized. No evidence of appendicitis. Musculoskeletal: Degenerative changes of the visualized spine, relatively mild. No osteoblastic or osteolytic lesions. IMPRESSION: 1. No definite acute findings. No renal or ureteral stones. No obstructive uropathy. 2. Significant prostate enlargement. Prostate indents the inferior bladder. Bladder wall is thickened. Findings are similar to the prior CT likely due to chronic bladder outlet obstruction from benign prostatic hypertrophy. Consider acute cystitis if there are consistent clinical symptoms. Electronically Signed   By: Amie Portlandavid  Ormond M.D.   On: 11/11/2015 21:58    EKG:   Orders placed or performed during the hospital encounter of 11/11/15  . ED EKG  . ED EKG  . ED EKG  . ED EKG  . EKG 12-Lead  . EKG 12-Lead      Management plans discussed with the patient, family and they are in agreement.  CODE STATUS:     Code Status Orders        Start     Ordered   11/12/15 0214  Full code   Continuous     11/12/15  0213    Advance Directive Documentation        Most Recent Value   Type of Advance Directive  Healthcare Power of Attorney   Pre-existing out of facility DNR order (yellow form or pink MOST form)     "MOST" Form in Place?        TOTAL TIME TAKING CARE OF THIS PATIEnt 45 minutes.  discussed with Child psychotherapist , nursing staff , prolonged discharge planning   Orlandria Kissner M.D on 11/12/2015 at 1:20 PM  Between 7am to 6pm - Pager - [phone]  After 6pm go to [email or web address] - password [email or web address]  [facility]  Office  [phone]  CC: Primary care physician; Pcp Not In System

## 2015-11-12 NOTE — Care Management Note (Signed)
Case Management Note  Patient Details  Name: Antonio Ford MRN: [id number] Date of Birth: 07-Jun-1951  Subjective/Objective:    Discussed discharge planning with [name] who chose [facility] from a list of providers. A referral was faxed to [facility] requesting home health PT and RN services.                Action/Plan:   Expected Discharge Date:  11/14/15               Expected Discharge Plan:     In-House Referral:     Discharge planning Services     Post Acute Care Choice:    Choice offered to:     DME Arranged:    DME Agency:     HH Arranged:    HH Agency:     Status of Service:     Medicare Important Message Given:    Date Medicare IM Given:    Medicare IM give by:    Date Additional Medicare IM Given:    Additional Medicare Important Message give by:     If discussed at Long Length of Stay Meetings, dates discussed:    Additional Comments:  Malacai Grantz A, RN 11/12/2015, 4:06 PM

## 2015-11-13 LAB — URINE CULTURE: Culture: >=100000

## 2015-11-14 LAB — BLOOD CULTURE ID PANEL (REFLEXED)
ACINETOBACTER BAUMANNII: NOT DETECTED
Candida albicans: NOT DETECTED
Candida glabrata: NOT DETECTED
Candida krusei: NOT DETECTED
Candida parapsilosis: NOT DETECTED
Candida tropicalis: NOT DETECTED
Carbapenem resistance: NOT DETECTED
ENTEROCOCCUS SPECIES: NOT DETECTED
Enterobacter cloacae complex: NOT DETECTED
Enterobacteriaceae species: NOT DETECTED
Escherichia coli: NOT DETECTED
HAEMOPHILUS INFLUENZAE: NOT DETECTED
Klebsiella oxytoca: NOT DETECTED
Klebsiella pneumoniae: NOT DETECTED
LISTERIA MONOCYTOGENES: NOT DETECTED
METHICILLIN RESISTANCE: NOT DETECTED
NEISSERIA MENINGITIDIS: NOT DETECTED
PSEUDOMONAS AERUGINOSA: NOT DETECTED
Proteus species: NOT DETECTED
SERRATIA MARCESCENS: NOT DETECTED
STAPHYLOCOCCUS AUREUS BCID: NOT DETECTED
STAPHYLOCOCCUS SPECIES: NOT DETECTED
STREPTOCOCCUS SPECIES: NOT DETECTED
Streptococcus agalactiae: NOT DETECTED
Streptococcus pneumoniae: NOT DETECTED
Streptococcus pyogenes: NOT DETECTED
VANCOMYCIN RESISTANCE: NOT DETECTED

## 2015-11-16 ENCOUNTER — Encounter: Payer: Self-pay | Admitting: Urology

## 2015-11-16 ENCOUNTER — Ambulatory Visit (INDEPENDENT_AMBULATORY_CARE_PROVIDER_SITE_OTHER): Payer: Medicare Other | Admitting: Urology

## 2015-11-16 VITALS — BP 114/63 | HR 81 | Ht 62.0 in | Wt 122.3 lb

## 2015-11-16 DIAGNOSIS — N400 Enlarged prostate without lower urinary tract symptoms: Secondary | ICD-10-CM | POA: Diagnosis not present

## 2015-11-16 DIAGNOSIS — R3129 Other microscopic hematuria: Secondary | ICD-10-CM | POA: Diagnosis not present

## 2015-11-16 LAB — URINALYSIS, COMPLETE
BILIRUBIN UA: NEGATIVE
Glucose, UA: NEGATIVE
Ketones, UA: NEGATIVE
LEUKOCYTES UA: NEGATIVE
Nitrite, UA: NEGATIVE
PH UA: 5.5 (ref 5.0–7.5)
Protein, UA: NEGATIVE
RBC UA: NEGATIVE
Specific Gravity, UA: <1.005 — ABNORMAL LOW (ref 1.005–1.030)
Urobilinogen, Ur: 0.2 mg/dL (ref 0.2–1.0)

## 2015-11-16 LAB — MICROSCOPIC EXAMINATION
Bacteria, UA: NONE SEEN
EPITHELIAL CELLS (NON RENAL): NONE SEEN /HPF (ref 0–10)
RBC MICROSCOPIC, UA: NONE SEEN /HPF (ref 0–?)

## 2015-11-16 LAB — BLADDER SCAN AMB NON-IMAGING

## 2015-11-16 LAB — CULTURE, BLOOD (ROUTINE X 2)

## 2015-11-16 MED ORDER — FINASTERIDE 5 MG PO TABS: 5.0000 mg | ORAL_TABLET | Freq: Every day | ORAL | Status: AC

## 2015-11-16 NOTE — Progress Notes (Signed)
11/16/2015 2:42 PM   Antonio Ford [birth year] [id number]  Referring provider: No referring provider defined for this encounter.  Chief Complaint  Patient presents with  . Urinary Tract Infection    New Patient    HPI: The patient is a 65 year old gentleman with a past metal history significant for BPH who was recently discharged from the hospital for urinary tract infection and AKI.   The patient has significant urinary symptoms that have been going on for the last few years. His significant frequency, urgency, and straining. He also has nocturia 5. He reports his frequency is 25 minutes in the day. He was seen in the ER for this was started on Flomax and Ditropan. His PVR today is 250 cc. He is had microscopic hematuria before, but it has always been when he had a UTI. His urinalysis today has no red blood cells.  His urine culture during his admission grew Escherichia coli.  CT showed massively enlarged prostate with protrusion the bladder as well as bladder wall thickening which could be related to his recent UTI.  On CT his prostate measured 5.9 x 5.2 x 5.7 cm. This equates to a 92 cc prostate.  His creatinine today is 2.01, however it was 3.39 during his admission. Unclear what baseline is.  IPSS: 23/6  PVR: 255 cc  PMH: Past Medical History  Diagnosis Date  . HTN (hypertension)   . HLD (hyperlipidemia)   . GERD (gastroesophageal reflux disease)   . CAD (coronary artery disease)     Surgical History: Past Surgical History  Procedure Laterality Date  . Hernia repair    . Cardiac catheterization      Home Medications:    Medication List       This list is accurate as of: 11/16/15  2:42 PM.  Always use your most recent med list.               atorvastatin 10 MG tablet  Commonly known as:  LIPITOR  Take 10 mg by mouth daily.     cephALEXin 500 MG capsule  Commonly known as:  KEFLEX  Take 1 capsule (500 mg total) by mouth 2 (two) times daily.     finasteride 5 MG tablet  Commonly known as:  PROSCAR  Take 1 tablet (5 mg total) by mouth daily.     metoprolol succinate 100 MG 24 hr tablet  Commonly known as:  TOPROL-XL  Take 100 mg by mouth daily.     omeprazole 20 MG capsule  Commonly known as:  PRILOSEC  Take 20 mg by mouth daily.     phenazopyridine 100 MG tablet  Commonly known as:  PYRIDIUM  Take 1 tablet (100 mg total) by mouth 3 (three) times daily with meals.     tamsulosin 0.4 MG Caps capsule  Commonly known as:  FLOMAX  Take 1 capsule (0.4 mg total) by mouth daily.        Allergies: No Known Allergies  Family History: Family History  Problem Relation Age of Onset  . Diabetes Sister   . Prostate cancer Neg Hx   . Bladder Cancer Neg Hx   . Kidney cancer Neg Hx   . Stroke Mother     Social History:  reports that he has quit smoking. He does not have any smokeless tobacco history on file. He reports that he uses illicit drugs (Marijuana) about twice per week. He reports that he does not drink alcohol.  ROS: UROLOGY Frequent Urination?: Yes  Hard to postpone urination?: Yes Burning/pain with urination?: Yes Get up at night to urinate?: Yes Leakage of urine?: Yes Urine stream starts and stops?: No Trouble starting stream?: No Do you have to strain to urinate?: Yes Blood in urine?: Yes Urinary tract infection?: Yes Sexually transmitted disease?: No Injury to kidneys or bladder?: No Painful intercourse?: No Weak stream?: No Erection problems?: No Penile pain?: No  Gastrointestinal Nausea?: No Vomiting?: No Indigestion/heartburn?: No Diarrhea?: No Constipation?: No  Constitutional Fever: No Night sweats?: No Weight loss?: No Fatigue?: No  Skin Skin rash/lesions?: No Itching?: No  Eyes Blurred vision?: No Double vision?: No  Ears/Nose/Throat Sore throat?: No Sinus problems?: No  Hematologic/Lymphatic Swollen glands?: No Easy bruising?: No  Cardiovascular Leg swelling?:  No Chest pain?: No  Respiratory Cough?: No Shortness of breath?: No  Endocrine Excessive thirst?: No  Musculoskeletal Back pain?: No Joint pain?: No  Neurological Headaches?: No Dizziness?: No  Psychologic Depression?: No Anxiety?: No  Physical Exam: BP 114/63 mmHg  Pulse 81  Ht 5\' 2"  (1.575 m)  Wt 122 lb 4.8 oz (55.475 kg)  BMI 22.36 kg/m2  Constitutional:  Alert and oriented, No acute distress. HEENT: [place] AT, moist mucus membranes.  Trachea midline, no masses. Cardiovascular: No clubbing, cyanosis, or edema. Respiratory: Normal respiratory effort, no increased work of breathing. GI: Abdomen is soft, nontender, nondistended, no abdominal masses GU: No CVA tenderness. Normal phallus. Uncircumcised. His testicles equally bilaterally. DRE: Smooth no nodules 3+. Skin: No rashes, bruises or suspicious lesions. Lymph: No cervical or inguinal adenopathy. Neurologic: Grossly intact, no focal deficits, moving all 4 extremities. Psychiatric: Normal mood and affect.  Laboratory Data: Lab Results  Component Value Date   WBC 10.3 11/12/2015   HGB 10.0* 11/12/2015   HCT 29.7* 11/12/2015   MCV 91.0 11/12/2015   PLT 208 11/12/2015    Lab Results  Component Value Date   CREATININE 2.01* 11/12/2015    No results found for: PSA  No results found for: TESTOSTERONE  Lab Results  Component Value Date   HGBA1C 5.9 11/12/2015    Urinalysis    Component Value Date/Time   COLORURINE YELLOW* 11/11/2015 2140   COLORURINE Amber 01/13/2013 0337   APPEARANCEUR CLOUDY* 11/11/2015 2140   APPEARANCEUR Turbid 01/13/2013 0337   LABSPEC 1.003* 11/11/2015 2140   LABSPEC 1.020 01/13/2013 0337   PHURINE 6.0 11/11/2015 2140   PHURINE 5.0 01/13/2013 0337   GLUCOSEU NEGATIVE 11/11/2015 2140   GLUCOSEU Negative 01/13/2013 0337   HGBUR 3+* 11/11/2015 2140   HGBUR 1+ 01/13/2013 0337   BILIRUBINUR NEGATIVE 11/11/2015 2140   BILIRUBINUR Negative 01/13/2013 0337   KETONESUR NEGATIVE  11/11/2015 2140   KETONESUR Trace 01/13/2013 0337   PROTEINUR 100* 11/11/2015 2140   PROTEINUR 100 mg/dL 11/91/4782 9562   NITRITE NEGATIVE 11/11/2015 2140   NITRITE Negative 01/13/2013 0337   LEUKOCYTESUR 3+* 11/11/2015 2140   LEUKOCYTESUR 3+ 01/13/2013 1308    Pertinent Imaging: Study Result     CLINICAL DATA: Hematuria today. Pelvic region pain.  EXAM: CT ABDOMEN AND PELVIS WITHOUT CONTRAST  TECHNIQUE: Multidetector CT imaging of the abdomen and pelvis was performed following the standard protocol without IV contrast.  COMPARISON: 01/13/2013  FINDINGS: Lung bases: Minor subsegmental atelectasis. Heart normal in size.  Liver, spleen, gallbladder, pancreas, adrenal glands: Normal.  Kidneys, ureters, bladder: No renal masses. No stones. No hydronephrosis.  Prostate gland is enlarged. It indents the bladder base. Bladder wall is thickened. No discrete bladder mass is seen. No bladder stone. Prostate  measures 5.9 x 5.2 x 5.7 cm. These findings are similar to the prior CT.  Lymph nodes: No pathologically enlarged lymph nodes.  Ascites: None.  Gastrointestinal: Unremarkable. Appendix not visualized. No evidence of appendicitis.  Musculoskeletal: Degenerative changes of the visualized spine, relatively mild. No osteoblastic or osteolytic lesions.  IMPRESSION: 1. No definite acute findings. No renal or ureteral stones. No obstructive uropathy. 2. Significant prostate enlargement. Prostate indents the inferior bladder. Bladder wall is thickened. Findings are similar to the prior CT likely due to chronic bladder outlet obstruction from benign prostatic hypertrophy. Consider acute cystitis if there are consistent clinical symptoms.     Assessment & Plan:    The patient is a very large prostate at approximately 92 cc and a high PVR of 250 cc. He is at the upper limit of safe post void residuals. We will continue his Flomax. Oxybutynin is  absolutely contraindicated in this situation, and it will be stopped. I will start him on finasteride 5 mg daily with the hope to shrink his prostate. He ultimately may need a surgical intervention. We will reassess his symptoms in 3 months. He is advised finasteride will take approximately 6 weeks to have a noticeable affect.  1. BPH -continue Flomax -discontinue Ditropan -Start finasteride 5 mg daily  2. Microscopic hematuria Appears to have resolved after treating urinary tract infection  Return in about 3 months (around 02/14/2016) for pvr/ipss/uroflow.  Hildred Laser, MD  [facility] [address] [place], [place] [address] [phone]

## 2015-11-17 LAB — CULTURE, BLOOD (ROUTINE X 2): CULTURE: NO GROWTH

## 2015-12-21 ENCOUNTER — Inpatient Hospital Stay
Admission: EM | Admit: 2015-12-21 | Discharge: 2015-12-23 | DRG: 690 | Disposition: A | Discharge status: Home / Self Care | Payer: Medicare Other | Attending: Internal Medicine | Admitting: Internal Medicine

## 2015-12-21 ENCOUNTER — Encounter: Payer: Self-pay | Admitting: Emergency Medicine

## 2015-12-21 DIAGNOSIS — Z79899 Other long term (current) drug therapy: Secondary | ICD-10-CM | POA: Diagnosis not present

## 2015-12-21 DIAGNOSIS — I129 Hypertensive chronic kidney disease with stage 1 through stage 4 chronic kidney disease, or unspecified chronic kidney disease: Secondary | ICD-10-CM | POA: Diagnosis present

## 2015-12-21 DIAGNOSIS — Z87440 Personal history of urinary (tract) infections: Secondary | ICD-10-CM | POA: Diagnosis not present

## 2015-12-21 DIAGNOSIS — K219 Gastro-esophageal reflux disease without esophagitis: Secondary | ICD-10-CM | POA: Diagnosis present

## 2015-12-21 DIAGNOSIS — R1110 Vomiting, unspecified: Secondary | ICD-10-CM

## 2015-12-21 DIAGNOSIS — R112 Nausea with vomiting, unspecified: Secondary | ICD-10-CM

## 2015-12-21 DIAGNOSIS — N183 Chronic kidney disease, stage 3 unspecified: Secondary | ICD-10-CM | POA: Diagnosis present

## 2015-12-21 DIAGNOSIS — I2510 Atherosclerotic heart disease of native coronary artery without angina pectoris: Secondary | ICD-10-CM | POA: Diagnosis present

## 2015-12-21 DIAGNOSIS — N390 Urinary tract infection, site not specified: Secondary | ICD-10-CM | POA: Diagnosis present

## 2015-12-21 DIAGNOSIS — B9620 Unspecified Escherichia coli [E. coli] as the cause of diseases classified elsewhere: Secondary | ICD-10-CM | POA: Diagnosis present

## 2015-12-21 DIAGNOSIS — Z87891 Personal history of nicotine dependence: Secondary | ICD-10-CM

## 2015-12-21 DIAGNOSIS — N400 Enlarged prostate without lower urinary tract symptoms: Secondary | ICD-10-CM | POA: Diagnosis present

## 2015-12-21 DIAGNOSIS — Z23 Encounter for immunization: Secondary | ICD-10-CM | POA: Diagnosis not present

## 2015-12-21 DIAGNOSIS — E785 Hyperlipidemia, unspecified: Secondary | ICD-10-CM | POA: Diagnosis present

## 2015-12-21 DIAGNOSIS — I10 Essential (primary) hypertension: Secondary | ICD-10-CM | POA: Diagnosis present

## 2015-12-21 LAB — CBC
HEMATOCRIT: 38 % — AB (ref 40.0–52.0)
Hemoglobin: 12.6 g/dL — ABNORMAL LOW (ref 13.0–18.0)
MCH: 30.1 pg (ref 26.0–34.0)
MCHC: 33.1 g/dL (ref 32.0–36.0)
MCV: 90.9 fL (ref 80.0–100.0)
PLATELETS: 369 10*3/uL (ref 150–440)
RBC: 4.18 MIL/uL — ABNORMAL LOW (ref 4.40–5.90)
RDW: 13.7 % (ref 11.5–14.5)
WBC: 9.7 10*3/uL (ref 3.8–10.6)

## 2015-12-21 LAB — TROPONIN I: Troponin I: <0.03 ng/mL (ref <0.031)

## 2015-12-21 LAB — URINALYSIS COMPLETE WITH MICROSCOPIC (ARMC ONLY)
BILIRUBIN URINE: NEGATIVE
GLUCOSE, UA: NEGATIVE mg/dL
KETONES UR: NEGATIVE mg/dL
NITRITE: POSITIVE — AB
Protein, ur: 100 mg/dL — AB
Specific Gravity, Urine: 1.017 (ref 1.005–1.030)
pH: 5 (ref 5.0–8.0)

## 2015-12-21 LAB — COMPREHENSIVE METABOLIC PANEL
ALT: 10 U/L — ABNORMAL LOW (ref 17–63)
ANION GAP: 10 (ref 5–15)
AST: 20 U/L (ref 15–41)
Albumin: 4.9 g/dL (ref 3.5–5.0)
Alkaline Phosphatase: 113 U/L (ref 38–126)
BILIRUBIN TOTAL: 1 mg/dL (ref 0.3–1.2)
BUN: 20 mg/dL (ref 6–20)
CO2: 23 mmol/L (ref 22–32)
Calcium: 10 mg/dL (ref 8.9–10.3)
Chloride: 105 mmol/L (ref 101–111)
Creatinine, Ser: 1.35 mg/dL — ABNORMAL HIGH (ref 0.61–1.24)
GFR calc Af Amer: >60 mL/min (ref >60)
GFR, EST NON AFRICAN AMERICAN: 53 mL/min — AB (ref >60)
Glucose, Bld: 106 mg/dL — ABNORMAL HIGH (ref 65–99)
POTASSIUM: 4 mmol/L (ref 3.5–5.1)
Sodium: 138 mmol/L (ref 135–145)
TOTAL PROTEIN: 8.9 g/dL — AB (ref 6.5–8.1)

## 2015-12-21 LAB — LIPASE, BLOOD: Lipase: 33 U/L (ref 11–51)

## 2015-12-21 MED ORDER — PROMETHAZINE HCL 25 MG/ML IJ SOLN
12.5000 mg | Freq: Once | INTRAMUSCULAR | Status: AC
  Administered 2015-12-21: 12.5 mg via INTRAVENOUS
  Filled 2015-12-21: qty 1

## 2015-12-21 MED ORDER — LABETALOL HCL 5 MG/ML IV SOLN
10.0000 mg | INTRAVENOUS | Status: DC | PRN
  Administered 2015-12-21 – 2015-12-22 (×3): 10 mg via INTRAVENOUS
  Filled 2015-12-21 (×4): qty 4

## 2015-12-21 MED ORDER — FINASTERIDE 5 MG PO TABS
5.0000 mg | ORAL_TABLET | Freq: Every day | ORAL | Status: DC
  Administered 2015-12-22 – 2015-12-23 (×2): 5 mg via ORAL
  Filled 2015-12-21 (×2): qty 1

## 2015-12-21 MED ORDER — ATORVASTATIN CALCIUM 10 MG PO TABS
10.0000 mg | ORAL_TABLET | Freq: Every day | ORAL | Status: DC
  Administered 2015-12-22 – 2015-12-23 (×2): 10 mg via ORAL
  Filled 2015-12-21 (×2): qty 1

## 2015-12-21 MED ORDER — ONDANSETRON HCL 4 MG PO TABS: 4.0000 mg | ORAL_TABLET | Freq: Four times a day (QID) | ORAL | Status: DC | PRN

## 2015-12-21 MED ORDER — SODIUM CHLORIDE 0.9 % IV BOLUS (SEPSIS)
1000.0000 mL | Freq: Once | INTRAVENOUS | Status: AC
  Administered 2015-12-21: 1000 mL via INTRAVENOUS

## 2015-12-21 MED ORDER — ACETAMINOPHEN 650 MG RE SUPP: 650.0000 mg | Freq: Four times a day (QID) | RECTAL | Status: DC | PRN

## 2015-12-21 MED ORDER — METOPROLOL SUCCINATE ER 50 MG PO TB24
100.0000 mg | ORAL_TABLET | Freq: Every day | ORAL | Status: DC
  Administered 2015-12-22 – 2015-12-23 (×2): 100 mg via ORAL
  Filled 2015-12-21 (×2): qty 2

## 2015-12-21 MED ORDER — SODIUM CHLORIDE 0.9 % IV SOLN
INTRAVENOUS | Status: AC
  Administered 2015-12-21: via INTRAVENOUS

## 2015-12-21 MED ORDER — INFLUENZA VAC SPLIT QUAD 0.5 ML IM SUSY
0.5000 mL | PREFILLED_SYRINGE | INTRAMUSCULAR | Status: AC
  Administered 2015-12-22: 0.5 mL via INTRAMUSCULAR
  Filled 2015-12-21: qty 0.5

## 2015-12-21 MED ORDER — ACETAMINOPHEN 325 MG PO TABS: 650.0000 mg | ORAL_TABLET | Freq: Four times a day (QID) | ORAL | Status: DC | PRN

## 2015-12-21 MED ORDER — PROMETHAZINE HCL 25 MG/ML IJ SOLN
12.5000 mg | Freq: Four times a day (QID) | INTRAMUSCULAR | Status: DC | PRN
  Administered 2015-12-22 (×2): 12.5 mg via INTRAVENOUS
  Filled 2015-12-21 (×2): qty 1

## 2015-12-21 MED ORDER — ENOXAPARIN SODIUM 40 MG/0.4ML ~~LOC~~ SOLN
40.0000 mg | Freq: Every day | SUBCUTANEOUS | Status: DC
  Administered 2015-12-21 – 2015-12-22 (×2): 40 mg via SUBCUTANEOUS
  Filled 2015-12-21 (×2): qty 0.4

## 2015-12-21 MED ORDER — DEXTROSE 5 % IV SOLN
1.0000 g | Freq: Once | INTRAVENOUS | Status: AC
  Administered 2015-12-21: 1 g via INTRAVENOUS

## 2015-12-21 MED ORDER — PNEUMOCOCCAL VAC POLYVALENT 25 MCG/0.5ML IJ INJ
0.5000 mL | INJECTION | INTRAMUSCULAR | Status: AC
  Administered 2015-12-22: 0.5 mL via INTRAMUSCULAR
  Filled 2015-12-21: qty 0.5

## 2015-12-21 MED ORDER — ONDANSETRON HCL 4 MG/2ML IJ SOLN
4.0000 mg | Freq: Four times a day (QID) | INTRAMUSCULAR | Status: DC | PRN
  Administered 2015-12-22 (×2): 4 mg via INTRAVENOUS
  Filled 2015-12-21 (×2): qty 2

## 2015-12-21 MED ORDER — DEXTROSE 5 % IV SOLN
1.0000 g | INTRAVENOUS | Status: DC
Start: 2015-12-22 — End: 2015-12-23
  Administered 2015-12-22: 1 g via INTRAVENOUS
  Filled 2015-12-21 (×2): qty 10

## 2015-12-21 MED ORDER — TAMSULOSIN HCL 0.4 MG PO CAPS
0.4000 mg | ORAL_CAPSULE | Freq: Every day | ORAL | Status: DC
  Administered 2015-12-22 – 2015-12-23 (×2): 0.4 mg via ORAL
  Filled 2015-12-21 (×2): qty 1

## 2015-12-21 MED ORDER — DEXTROSE 5 % IV SOLN
INTRAVENOUS | Status: AC
  Administered 2015-12-21: 1 g via INTRAVENOUS
  Filled 2015-12-21: qty 10

## 2015-12-21 MED ORDER — ONDANSETRON HCL 4 MG/2ML IJ SOLN
4.0000 mg | Freq: Once | INTRAMUSCULAR | Status: AC
  Administered 2015-12-21: 4 mg via INTRAVENOUS
  Filled 2015-12-21: qty 2

## 2015-12-21 MED ORDER — PANTOPRAZOLE SODIUM 40 MG PO TBEC
40.0000 mg | DELAYED_RELEASE_TABLET | Freq: Every day | ORAL | Status: DC
  Administered 2015-12-22: 40 mg via ORAL
  Filled 2015-12-21: qty 1

## 2015-12-21 NOTE — H&P (Signed)
[facility] - Ali Molina at [facility]   PATIENT NAME: Antonio Ford    MR#:  [id number]  DATE OF BIRTH:  13-Mar-1951  DATE OF ADMISSION:  12/21/2015  PRIMARY CARE PHYSICIAN: Pcp Not In System   REQUESTING/REFERRING PHYSICIAN: Alphonzo Lemmings, M.D.  CHIEF COMPLAINT:   Chief Complaint  Patient presents with  . Nausea  . Emesis    HISTORY OF PRESENT ILLNESS:  Antonio Ford  is a 65 y.o. male who presents with dysuria, abdominal pain, nausea and vomiting. Patient has a history of UTIs, likely secondary to his BPH. He started having some dysuria couple days ago, then he developed nausea and vomiting with lower abdominal discomfort. He came to ED for evaluation. Here he is found to be nitrite positive on UA. He was started on antibiotics, but hospitalists were called for admission when he was unable to obtain good nausea control despite several doses of antiemetics.  PAST MEDICAL HISTORY:   Past Medical History  Diagnosis Date  . HTN (hypertension)   . HLD (hyperlipidemia)   . GERD (gastroesophageal reflux disease)   . CAD (coronary artery disease)     PAST SURGICAL HISTORY:   Past Surgical History  Procedure Laterality Date  . Hernia repair    . Cardiac catheterization      SOCIAL HISTORY:   Social History  Substance Use Topics  . Smoking status: Former Games developer  . Smokeless tobacco: Not on file     Comment: quit on 11-14-15  . Alcohol Use: No    FAMILY HISTORY:   Family History  Problem Relation Age of Onset  . Diabetes Sister   . Prostate cancer Neg Hx   . Bladder Cancer Neg Hx   . Kidney cancer Neg Hx   . Stroke Mother     DRUG ALLERGIES:  No Known Allergies  MEDICATIONS AT HOME:   Prior to Admission medications   Medication Sig Start Date End Date Taking? Authorizing Provider  atorvastatin (LIPITOR) 10 MG tablet Take 10 mg by mouth daily. 11/04/15   Historical Provider, MD  cephALEXin (KEFLEX) 500 MG capsule Take 1 capsule (500 mg total) by  mouth 2 (two) times daily. 11/12/15   Katharina Caper, MD  finasteride (PROSCAR) 5 MG tablet Take 1 tablet (5 mg total) by mouth daily. 11/16/15   Hildred Laser, MD  metoprolol succinate (TOPROL-XL) 100 MG 24 hr tablet Take 100 mg by mouth daily. 10/27/15   Historical Provider, MD  omeprazole (PRILOSEC) 20 MG capsule Take 20 mg by mouth daily. 10/31/15   Historical Provider, MD  phenazopyridine (PYRIDIUM) 100 MG tablet Take 1 tablet (100 mg total) by mouth 3 (three) times daily with meals. 11/12/15   Katharina Caper, MD  tamsulosin (FLOMAX) 0.4 MG CAPS capsule Take 1 capsule (0.4 mg total) by mouth daily. 11/12/15   Katharina Caper, MD    REVIEW OF SYSTEMS:  Review of Systems  Constitutional: Negative for fever, chills, weight loss and malaise/fatigue.  HENT: Negative for ear pain, hearing loss and tinnitus.   Eyes: Negative for blurred vision, double vision, pain and redness.  Respiratory: Negative for cough, hemoptysis and shortness of breath.   Cardiovascular: Negative for chest pain, palpitations, orthopnea and leg swelling.  Gastrointestinal: Positive for nausea, vomiting and abdominal pain. Negative for diarrhea and constipation.  Genitourinary: Positive for dysuria. Negative for frequency and hematuria.  Musculoskeletal: Negative for back pain, joint pain and neck pain.  Skin:       No acne, rash, or  lesions  Neurological: Negative for dizziness, tremors, focal weakness and weakness.  Endo/Heme/Allergies: Negative for polydipsia. Does not bruise/bleed easily.  Psychiatric/Behavioral: Negative for depression. The patient is not nervous/anxious and does not have insomnia.      VITAL SIGNS:   Filed Vitals:   12/21/15 1839  BP: 180/85  Pulse: 77  Temp: 98.5 F (36.9 C)  TempSrc: Oral  Resp: 20  Height: 5\' 2"  (1.575 m)  Weight: 52.164 kg (115 lb)  SpO2: 100%   Wt Readings from Last 3 Encounters:  12/21/15 52.164 kg (115 lb)  11/16/15 55.475 kg (122 lb 4.8 oz)  11/12/15 53.298 kg  (117 lb 8 oz)    PHYSICAL EXAMINATION:  Physical Exam  Vitals reviewed. Constitutional: He is oriented to person, place, and time. He appears well-developed and well-nourished. He appears distressed (Appears uncomfortable).  HENT:  Head: Normocephalic and atraumatic.  Mouth/Throat: Oropharynx is clear and moist.  Eyes: Conjunctivae and EOM are normal. Pupils are equal, round, and reactive to light. No scleral icterus.  Neck: Normal range of motion. Neck supple. No JVD present. No thyromegaly present.  Cardiovascular: Normal rate, regular rhythm and intact distal pulses.  Exam reveals no gallop and no friction rub.   No murmur heard. Respiratory: Effort normal and breath sounds normal. No respiratory distress. He has no wheezes. He has no rales.  GI: Soft. Bowel sounds are normal. He exhibits no distension. There is tenderness (low abdomen).  Musculoskeletal: Normal range of motion. He exhibits no edema.  No arthritis, no gout  Lymphadenopathy:    He has no cervical adenopathy.  Neurological: He is alert and oriented to person, place, and time. No cranial nerve deficit.  No dysarthria, no aphasia  Skin: Skin is warm and dry. No rash noted. No erythema.  Psychiatric: He has a normal mood and affect. His behavior is normal. Judgment and thought content normal.    LABORATORY PANEL:   CBC  Recent Labs Lab 12/21/15 1842  WBC 9.7  HGB 12.6*  HCT 38.0*  PLT 369   ------------------------------------------------------------------------------------------------------------------  Chemistries   Recent Labs Lab 12/21/15 1842  NA 138  K 4.0  CL 105  CO2 23  GLUCOSE 106*  BUN 20  CREATININE 1.35*  CALCIUM 10.0  AST 20  ALT 10*  ALKPHOS 113  BILITOT 1.0   ------------------------------------------------------------------------------------------------------------------  Cardiac Enzymes  Recent Labs Lab 12/21/15 1842  TROPONINI <0.03    ------------------------------------------------------------------------------------------------------------------  RADIOLOGY:  No results found.  EKG:   Orders placed or performed during the hospital encounter of 12/21/15  . ED EKG  . ED EKG    IMPRESSION AND PLAN:  Principal Problem:   UTI (lower urinary tract infection) - IV antibiotics started in the ED, continues on admission, urine culture sent. Active Problems:   BPH (benign prostatic hyperplasia) - continue home meds   Intractable nausea and vomiting - alternate different classes of antiemetics here for nausea control   HTN (hypertension) - continue home meds   CAD (coronary artery disease) - continue home meds   HLD (hyperlipidemia) - continue home meds   GERD (gastroesophageal reflux disease) - home dose PPI   CKD (chronic kidney disease), stage III - seems to be at baseline, stable, avoid nephrotoxins, monitor  All the records are reviewed and case discussed with ED provider. Management plans discussed with the patient and/or family.  DVT PROPHYLAXIS: SubQ lovenox  GI PROPHYLAXIS: PPI  ADMISSION STATUS: Inpatient  CODE STATUS: Full Code Status History    Date  Active Date Inactive Code Status Order ID Comments User Context   11/12/2015  2:13 AM 11/12/2015  9:29 PM Full Code [id number]  Oralia Manis, MD Inpatient      TOTAL TIME TAKING CARE OF THIS PATIENT: 40 minutes.    Bhavin Monjaraz FIELDING 12/21/2015, 10:31 PM  [facility]  Office  [phone]  CC: Primary care physician; Pcp Not In System

## 2015-12-21 NOTE — ED Notes (Signed)
Pt presents with abd pain, n/v started today.

## 2015-12-21 NOTE — Progress Notes (Signed)
Pharmacy Antibiotic Note  Antonio Ford is a 65 y.o. male admitted on 12/21/2015 with UTI.  Pharmacy has been consulted for ceftriaxone dosing.  Plan: Ceftriaxone 1 gm IV Q24H.   Height:  (157.5 cm) Weight: 115 lb (52.164 kg) IBW/kg (Calculated) : 54.6  Temp (24hrs), Avg:98.5 F (36.9 C), Min:98.4 F (36.9 C), Max:98.5 F (36.9 C)   Recent Labs Lab 12/21/15 1842  WBC 9.7  CREATININE 1.35*    Estimated Creatinine Clearance: 39.7 mL/min (by C-G formula based on Cr of 1.35).    No Known Allergies   Thank you for allowing pharmacy to be a part of this patient's care.  Carola Frost, Pharm.D., BCPS Clinical Pharmacist 12/21/2015 11:25 PM

## 2015-12-21 NOTE — ED Provider Notes (Addendum)
[facility] Emergency Department Provider Note  ____________________________________________   I have reviewed the triage vital signs and the nursing notes.   HISTORY  Chief Complaint Nausea and Emesis    HPI Antonio Ford is a 65 y.o. male presents today with vomiting. No fever. Vomiting since this morning. No chest pain or shortness of breath. Has a history of hypertension GERD and hyperlipidemia. Also recurrent urinary tract infections. Patient does state he has dysuria. He denies any fever or chills.   Past Medical History  Diagnosis Date  . HTN (hypertension)   . HLD (hyperlipidemia)   . GERD (gastroesophageal reflux disease)   . CAD (coronary artery disease)     Patient Active Problem List   Diagnosis Date Noted  . Elevated troponin 11/12/2015  . Hyponatremia 11/12/2015  . Dehydration 11/12/2015  . Leukocytosis 11/12/2015  . BPH (benign prostatic hyperplasia) 11/12/2015  . Acute hemorrhagic cystitis 11/11/2015  . HTN (hypertension) 11/11/2015  . HLD (hyperlipidemia) 11/11/2015  . GERD (gastroesophageal reflux disease) 11/11/2015  . CAD (coronary artery disease) 11/11/2015  . AKI (acute kidney injury) (HCC) 11/11/2015  . Angina pectoris (HCC) 03/25/2014  . Chest pain 03/25/2014  . Hypercholesterolemia 03/25/2014  . Current smoker 03/25/2014  . Current tobacco use 03/31/2013  . Acute renal failure (HCC) 03/30/2013  . Injury of kidney 03/30/2013  . Elevated troponin I level 03/30/2013  . Abnormal blood chemistry 03/30/2013  . BP (high blood pressure) 03/28/2013  . Traumatic intracranial subarachnoid hemorrhage (HCC) 03/27/2013    Past Surgical History  Procedure Laterality Date  . Hernia repair    . Cardiac catheterization      Current Outpatient Rx  Name  Route  Sig  Dispense  Refill  . atorvastatin (LIPITOR) 10 MG tablet   Oral   Take 10 mg by mouth daily.      5   . cephALEXin (KEFLEX) 500 MG capsule  Oral   Take 1 capsule (500 mg total) by mouth 2 (two) times daily.   20 capsule   0   . finasteride (PROSCAR) 5 MG tablet   Oral   Take 1 tablet (5 mg total) by mouth daily.   30 tablet   11   . metoprolol succinate (TOPROL-XL) 100 MG 24 hr tablet   Oral   Take 100 mg by mouth daily.      5   . omeprazole (PRILOSEC) 20 MG capsule   Oral   Take 20 mg by mouth daily.      5   . phenazopyridine (PYRIDIUM) 100 MG tablet   Oral   Take 1 tablet (100 mg total) by mouth 3 (three) times daily with meals.   10 tablet   0   . tamsulosin (FLOMAX) 0.4 MG CAPS capsule   Oral   Take 1 capsule (0.4 mg total) by mouth daily.   30 capsule   5     Allergies Review of patient's allergies indicates no known allergies.  Family History  Problem Relation Age of Onset  . Diabetes Sister   . Prostate cancer Neg Hx   . Bladder Cancer Neg Hx   . Kidney cancer Neg Hx   . Stroke Mother     Social History Social History  Substance Use Topics  . Smoking status: Former Games developer  . Smokeless tobacco: None     Comment: quit on 11-14-15  . Alcohol Use: No    Review of Systems Constitutional: No fever/chills Eyes: No visual changes. ENT:  No sore throat. No stiff neck no neck pain Cardiovascular: Denies chest pain. Respiratory: Denies shortness of breath. Gastrointestinal:   Positive vomiting.  No diarrhea.  No constipation. Genitourinary: Positive for dysuria. Musculoskeletal: Negative lower extremity swelling Skin: Negative for rash. Neurological: Negative for headaches, focal weakness or numbness. 10-point ROS otherwise negative.  ____________________________________________   PHYSICAL EXAM:  VITAL SIGNS: ED Triage Vitals  Enc Vitals Group     BP 12/21/15 1839 180/85 mmHg     Pulse Rate 12/21/15 1839 77     Resp 12/21/15 1839 20     Temp 12/21/15 1839 98.5 F (36.9 C)     Temp Source 12/21/15 1839 Oral     SpO2 12/21/15 1839 100 %     Weight 12/21/15 1839 115 lb (52.164  kg)     Height 12/21/15 1839 5\' 2"  (1.575 m)     Head Cir --      Peak Flow --      Pain Score 12/21/15 1840 10     Pain Loc --      Pain Edu? --      Excl. in GC? --     Constitutional: Alert and oriented. Well appearing and in no acute distress., Patient actively vomiting when I initially enter the room. Eyes: Conjunctivae are normal. PERRL. EOMI. Head: Atraumatic. Nose: No congestion/rhinnorhea. Mouth/Throat: Mucous membranes are dry.  Oropharynx non-erythematous. Neck: No stridor.   Nontender with no meningismus Cardiovascular: Normal rate, regular rhythm. Grossly normal heart sounds.  Good peripheral circulation. Respiratory: Normal respiratory effort.  No retractions. Lungs CTAB. Abdominal: Soft and nontender. No distention. No guarding no rebound Back:  There is no focal tenderness or step off there is no midline tenderness there are no lesions noted. there is no CVA tenderness Musculoskeletal: No lower extremity tenderness. No joint effusions, no DVT signs strong distal pulses no edema Neurologic:  Normal speech and language. No gross focal neurologic deficits are appreciated.  Skin:  Skin is warm, dry and intact. No rash noted. Psychiatric: Mood and affect are normal. Speech and behavior are normal.  ____________________________________________   LABS (all labs ordered are listed, but only abnormal results are displayed)  Labs Reviewed  COMPREHENSIVE METABOLIC PANEL - Abnormal; Notable for the following:    Glucose, Bld 106 (*)    Creatinine, Ser 1.35 (*)    Total Protein 8.9 (*)    ALT 10 (*)    GFR calc non Af Amer 53 (*)    All other components within normal limits  CBC - Abnormal; Notable for the following:    RBC 4.18 (*)    Hemoglobin 12.6 (*)    HCT 38.0 (*)    All other components within normal limits  URINALYSIS COMPLETEWITH MICROSCOPIC ([facility] ONLY) - Abnormal; Notable for the following:    Color, Urine YELLOW (*)    APPearance CLOUDY (*)    Hgb urine  dipstick 2+ (*)    Protein, ur 100 (*)    Nitrite POSITIVE (*)    Leukocytes, UA 3+ (*)    Bacteria, UA RARE (*)    Squamous Epithelial / LPF 0-5 (*)    All other components within normal limits  CHLAMYDIA/NGC RT PCR ([facility] ONLY)  URINE CULTURE  LIPASE, BLOOD  TROPONIN I   ____________________________________________  EKG  I personally interpreted any EKGs ordered by me or triage Sinus rhythm at 60 bpm no acute ST elevation or depression normal axis, repolarization abnormality noted on prior EKG ____________________________________________  RADIOLOGY  I reviewed any imaging ordered by me or triage that were performed during my shift ____________________________________________   PROCEDURES  Procedure(s) performed: None  Critical Care performed: None  ____________________________________________   INITIAL IMPRESSION / ASSESSMENT AND PLAN / ED COURSE  Pertinent labs & imaging results that were available during my care of the patient were reviewed by me and considered in my medical decision making (see chart for details). Patient with persistent vomiting in the emergency room despite antiemetics. We are giving him IV fluids. He has urinary tract infection. Prior results do show sensitivity to Rocephin and we will give him that. I'm concerned given his persistent vomiting that he may end up staying in the hospital until he feels better ____________________________________________   FINAL CLINICAL IMPRESSION(S) / ED DIAGNOSES  Final diagnoses:  UTI (lower urinary tract infection)  Intractable vomiting with nausea, vomiting of unspecified type      This chart was dictated using voice recognition software.  Despite best efforts to proofread,  errors can occur which can change meaning.     Jeanmarie Plant, MD 12/21/15 2141  Jeanmarie Plant, MD 12/21/15 2147

## 2015-12-22 DIAGNOSIS — Z87891 Personal history of nicotine dependence: Secondary | ICD-10-CM | POA: Diagnosis not present

## 2015-12-22 DIAGNOSIS — K219 Gastro-esophageal reflux disease without esophagitis: Secondary | ICD-10-CM | POA: Diagnosis present

## 2015-12-22 DIAGNOSIS — N390 Urinary tract infection, site not specified: Secondary | ICD-10-CM | POA: Diagnosis present

## 2015-12-22 DIAGNOSIS — B9620 Unspecified Escherichia coli [E. coli] as the cause of diseases classified elsewhere: Secondary | ICD-10-CM | POA: Diagnosis present

## 2015-12-22 DIAGNOSIS — I129 Hypertensive chronic kidney disease with stage 1 through stage 4 chronic kidney disease, or unspecified chronic kidney disease: Secondary | ICD-10-CM | POA: Diagnosis present

## 2015-12-22 DIAGNOSIS — Z23 Encounter for immunization: Secondary | ICD-10-CM | POA: Diagnosis not present

## 2015-12-22 DIAGNOSIS — Z79899 Other long term (current) drug therapy: Secondary | ICD-10-CM | POA: Diagnosis not present

## 2015-12-22 DIAGNOSIS — I2510 Atherosclerotic heart disease of native coronary artery without angina pectoris: Secondary | ICD-10-CM | POA: Diagnosis present

## 2015-12-22 DIAGNOSIS — E785 Hyperlipidemia, unspecified: Secondary | ICD-10-CM | POA: Diagnosis present

## 2015-12-22 DIAGNOSIS — N183 Chronic kidney disease, stage 3 (moderate): Secondary | ICD-10-CM | POA: Diagnosis present

## 2015-12-22 DIAGNOSIS — Z87440 Personal history of urinary (tract) infections: Secondary | ICD-10-CM | POA: Diagnosis not present

## 2015-12-22 DIAGNOSIS — N400 Enlarged prostate without lower urinary tract symptoms: Secondary | ICD-10-CM | POA: Diagnosis present

## 2015-12-22 DIAGNOSIS — R112 Nausea with vomiting, unspecified: Secondary | ICD-10-CM | POA: Diagnosis present

## 2015-12-22 LAB — BASIC METABOLIC PANEL
ANION GAP: 11 (ref 5–15)
BUN: 20 mg/dL (ref 6–20)
CO2: 19 mmol/L — ABNORMAL LOW (ref 22–32)
Calcium: 9.3 mg/dL (ref 8.9–10.3)
Chloride: 111 mmol/L (ref 101–111)
Creatinine, Ser: 1.3 mg/dL — ABNORMAL HIGH (ref 0.61–1.24)
GFR calc non Af Amer: 56 mL/min — ABNORMAL LOW (ref >60)
GLUCOSE: 105 mg/dL — AB (ref 65–99)
POTASSIUM: 4.1 mmol/L (ref 3.5–5.1)
SODIUM: 141 mmol/L (ref 135–145)

## 2015-12-22 LAB — CHLAMYDIA/NGC RT PCR (ARMC ONLY)
CHLAMYDIA TR: NOT DETECTED
N GONORRHOEAE: NOT DETECTED

## 2015-12-22 LAB — CBC
HEMATOCRIT: 35.7 % — AB (ref 40.0–52.0)
HEMOGLOBIN: 11.9 g/dL — AB (ref 13.0–18.0)
MCH: 30.4 pg (ref 26.0–34.0)
MCHC: 33.3 g/dL (ref 32.0–36.0)
MCV: 91.1 fL (ref 80.0–100.0)
Platelets: 334 10*3/uL (ref 150–440)
RBC: 3.92 MIL/uL — ABNORMAL LOW (ref 4.40–5.90)
RDW: 13.6 % (ref 11.5–14.5)
WBC: 6 10*3/uL (ref 3.8–10.6)

## 2015-12-22 MED ORDER — OXYBUTYNIN CHLORIDE 5 MG PO TABS: 5.0000 mg | ORAL_TABLET | Freq: Three times a day (TID) | ORAL | Status: DC | PRN

## 2015-12-22 MED ORDER — LISINOPRIL 10 MG PO TABS
10.0000 mg | ORAL_TABLET | Freq: Every day | ORAL | Status: DC
  Administered 2015-12-23: 10 mg via ORAL
  Filled 2015-12-22: qty 1

## 2015-12-22 MED ORDER — SODIUM CHLORIDE 0.9 % IV SOLN
INTRAVENOUS | Status: AC
  Administered 2015-12-22: 17:00:00 via INTRAVENOUS

## 2015-12-22 MED ORDER — HYDRALAZINE HCL 20 MG/ML IJ SOLN
20.0000 mg | Freq: Once | INTRAMUSCULAR | Status: AC
  Administered 2015-12-22: 20 mg via INTRAVENOUS
  Filled 2015-12-22: qty 1

## 2015-12-22 MED ORDER — SENNOSIDES-DOCUSATE SODIUM 8.6-50 MG PO TABS
1.0000 | ORAL_TABLET | Freq: Once | ORAL | Status: AC
  Administered 2015-12-22: 1 via ORAL
  Filled 2015-12-22: qty 1

## 2015-12-22 MED ORDER — PANTOPRAZOLE SODIUM 40 MG IV SOLR
40.0000 mg | Freq: Two times a day (BID) | INTRAVENOUS | Status: DC
  Administered 2015-12-22 – 2015-12-23 (×2): 40 mg via INTRAVENOUS
  Filled 2015-12-22 (×2): qty 40

## 2015-12-22 NOTE — Progress Notes (Addendum)
[facility] [place] at [facility]   PATIENT NAME: Antonio Ford    MR#:  [id number]  DATE OF BIRTH:  Jul 24, 1951  SUBJECTIVE:  CHIEF COMPLAINT:  Pt is persistently nauseated and still vomiting. Denies any abdominal pain. Denies any hematemesis. Could not tolerate clear liquids  REVIEW OF SYSTEMS:  CONSTITUTIONAL: No fever, fatigue or weakness.  EYES: No blurred or double vision.  EARS, NOSE, AND THROAT: No tinnitus or ear pain.  RESPIRATORY: No cough, shortness of breath, wheezing or hemoptysis.  CARDIOVASCULAR: No chest pain, orthopnea, edema.  GASTROINTESTINAL: Persistent nausea, vomiting, denies diarrhea or abdominal pain.  GENITOURINARY: No dysuria, hematuria.  ENDOCRINE: No polyuria, nocturia,  HEMATOLOGY: No anemia, easy bruising or bleeding SKIN: No rash or lesion. MUSCULOSKELETAL: No joint pain or arthritis.   NEUROLOGIC: No tingling, numbness, weakness.  PSYCHIATRY: No anxiety or depression.   DRUG ALLERGIES:  No Known Allergies  VITALS:  Blood pressure 194/76, pulse 76, temperature 98.3 F (36.8 C), temperature source Oral, resp. rate 15, height  (1.575 m), weight 50.531 kg (111 lb 6.4 oz), SpO2 100 %.  PHYSICAL EXAMINATION:  GENERAL:  65 y.o.-year-old patient lying in the bed with no acute distress.  EYES: Pupils equal, round, reactive to light and accommodation. No scleral icterus. Extraocular muscles intact.  HEENT: Head atraumatic, normocephalic. Oropharynx and nasopharynx clear.  NECK:  Supple, no jugular venous distention. No thyroid enlargement, no tenderness.  LUNGS: Normal breath sounds bilaterally, no wheezing, rales,rhonchi or crepitation. No use of accessory muscles of respiration.  CARDIOVASCULAR: S1, S2 normal. No murmurs, rubs, or gallops.  ABDOMEN: Soft, nontender, nondistended. Bowel sounds present. No organomegaly or mass.  EXTREMITIES: No pedal edema, cyanosis, or clubbing.  NEUROLOGIC: Cranial nerves II through XII  are intact. Muscle strength 5/5 in all extremities. Sensation intact. Gait not checked.  PSYCHIATRIC: The patient is alert and oriented x 3.  SKIN: No obvious rash, lesion, or ulcer.    LABORATORY PANEL:   CBC  Recent Labs Lab 12/22/15 0435  WBC 6.0  HGB 11.9*  HCT 35.7*  PLT 334   ------------------------------------------------------------------------------------------------------------------  Chemistries   Recent Labs Lab 12/21/15 1842 12/22/15 0435  NA 138 141  K 4.0 4.1  CL 105 111  CO2 23 19*  GLUCOSE 106* 105*  BUN 20 20  CREATININE 1.35* 1.30*  CALCIUM 10.0 9.3  AST 20  --   ALT 10*  --   ALKPHOS 113  --   BILITOT 1.0  --    ------------------------------------------------------------------------------------------------------------------  Cardiac Enzymes  Recent Labs Lab 12/21/15 1842  TROPONINI <0.03   ------------------------------------------------------------------------------------------------------------------  RADIOLOGY:  No results found.  EKG:   Orders placed or performed during the hospital encounter of 12/21/15  . ED EKG  . ED EKG    ASSESSMENT AND PLAN:   # UTI (lower urinary tract infection)  Urine cultures with Escherichia coli sensitivities pending Continue IV Rocephin and IV fluids   # BPH (benign prostatic hyperplasia) - continue home meds  # Intractable nausea and vomiting -  alternate different classes of antiemetics here for nausea control Nothing by mouth and IV fluids PPI If no clinical improvement will consider GI consult   #HTN (hypertension) - continue home meds, if tolerates by mouth  # CAD (coronary artery disease) - continue home meds, if tolerates by mouth  # HLD (hyperlipidemia) - continue home meds, if tolerates by mouth   #GERD (gastroesophageal reflux disease) -  PPI   #CKD (chronic kidney disease), stage  III - seems to be at baseline, stable, avoid nephrotoxins, monitor     All the  records are reviewed and case discussed with Care Management/Social Workerr. Management plans discussed with the patient, he is also my patient and reviewed in IC [id number] in agreement.  CODE STATUS: FC   TOTAL TIME TAKING CARE OF THIS PATIENT: 35  minutes.   POSSIBLE D/C IN 2-3DAYS, DEPENDING ON CLINICAL CONDITION.   Ramonita Lab M.D on 12/22/2015 at 3:59 PM  Between 7am to 6pm - Pager - [phone] After 6pm go to [email or web address] - password [email or web address]  [facility]  Office  [phone]  CC: Primary care physician; Pcp Not In System

## 2015-12-22 NOTE — Plan of Care (Signed)
Pt dirty urine sent down to lab for Clamydia PCR screen.  Lab notified that RN unable to provide "printer label" for specimen, but pt label and indicates for sample were attached.  Lab stated that was fine.

## 2015-12-23 ENCOUNTER — Observation Stay: Payer: Medicare Other

## 2015-12-23 DIAGNOSIS — N390 Urinary tract infection, site not specified: Secondary | ICD-10-CM | POA: Diagnosis not present

## 2015-12-23 LAB — URINE CULTURE

## 2015-12-23 MED ORDER — DOCUSATE SODIUM 100 MG PO CAPS: 100.0000 mg | ORAL_CAPSULE | Freq: Two times a day (BID) | ORAL | Status: AC | PRN

## 2015-12-23 MED ORDER — PROMETHAZINE HCL 12.5 MG RE SUPP: 12.5000 mg | Freq: Three times a day (TID) | RECTAL | Status: AC | PRN

## 2015-12-23 MED ORDER — CIPROFLOXACIN HCL 250 MG PO TABS
500.0000 mg | ORAL_TABLET | Freq: Two times a day (BID) | ORAL | Status: DC
  Administered 2015-12-23: 500 mg via ORAL
  Filled 2015-12-23: qty 2

## 2015-12-23 MED ORDER — CIPROFLOXACIN HCL 500 MG PO TABS: 500.0000 mg | ORAL_TABLET | Freq: Two times a day (BID) | ORAL | Status: AC

## 2015-12-23 MED ORDER — MAGNESIUM HYDROXIDE 400 MG/5ML PO SUSP
15.0000 mL | Freq: Every day | ORAL | Status: DC
  Administered 2015-12-23: 15 mL via ORAL
  Filled 2015-12-23: qty 30

## 2015-12-23 MED ORDER — SODIUM CHLORIDE 0.9 % IV SOLN
INTRAVENOUS | Status: DC
  Administered 2015-12-23: 07:00:00 via INTRAVENOUS

## 2015-12-23 NOTE — Discharge Instructions (Signed)
Activity as tolerated Diet heart healthy Follow-up with primary care physician in a week

## 2015-12-23 NOTE — Progress Notes (Signed)
Dr. Tobi Bastos notified of IVF order expiration; new order written to continue IVF at present rate. Stuart Guillen K, RN5:50 AM 12/23/2015

## 2015-12-23 NOTE — Discharge Summary (Signed)
[facility] -  at [facility]   PATIENT NAME: Antonio Ford    MR#:  [id number]  DATE OF BIRTH:  1959-10-19  DATE OF ADMISSION:  12/21/2015 ADMITTING PHYSICIAN: Oralia Manis, MD  DATE OF DISCHARGE: 12/23/2015 PRIMARY CARE PHYSICIAN: Pcp Not In System    ADMISSION DIAGNOSIS:  UTI (lower urinary tract infection) [N39.0] Intractable vomiting with nausea, vomiting of unspecified type [R11.10]  DISCHARGE DIAGNOSIS:  Principal Problem:   UTI (lower urinary tract infection) Active Problems:   HTN (hypertension)   HLD (hyperlipidemia)   GERD (gastroesophageal reflux disease)   CAD (coronary artery disease)   BPH (benign prostatic hyperplasia)   CKD (chronic kidney disease), stage III   Intractable nausea and vomiting   SECONDARY DIAGNOSIS:   Past Medical History  Diagnosis Date  . HTN (hypertension)   . HLD (hyperlipidemia)   . GERD (gastroesophageal reflux disease)   . CAD (coronary artery disease)     HOSPITAL COURSE:   # UTI (lower urinary tract infection)  Urine cultures with Escherichia coli sensitive to ciprofloxacin, Rocephin Treated with IV Rocephin and IV fluids during hospital course. Discharged home with by mouth ciprofloxacin. Patient is clinically doing fine  # BPH (benign prostatic hyperplasia) - continue home meds  # Intractable nausea and vomiting - resolved alternate different classes of antiemetics here for nausea control Nothing by mouth and IV fluids provided yesterday. Patient denies any vomiting overnight. Clinically improved. Tolerating diet. Will discharge home   #HTN (hypertension) - continue home meds  # CAD (coronary artery disease) - continue home meds,   # HLD (hyperlipidemia) - continue home meds   #GERD (gastroesophageal reflux disease) - PPI   #CKD (chronic kidney disease), stage III - seems to be at baseline, stable, avoid nephrotoxins, monitor    DISCHARGE CONDITIONS:    fair  CONSULTS  OBTAINED:      PROCEDURES none  DRUG ALLERGIES:  No Known Allergies  DISCHARGE MEDICATIONS:   Current Discharge Medication List    START taking these medications   Details  ciprofloxacin (CIPRO) 500 MG tablet Take 1 tablet (500 mg total) by mouth 2 (two) times daily. Qty: 14 tablet, Refills: 0    docusate sodium (COLACE) 100 MG capsule Take 1 capsule (100 mg total) by mouth 2 (two) times daily as needed for mild constipation. Qty: 10 capsule, Refills: 0    promethazine (PHENERGAN) 12.5 MG suppository Place 1 suppository (12.5 mg total) rectally every 8 (eight) hours as needed for nausea or vomiting. Qty: 12 each, Refills: 0      CONTINUE these medications which have NOT CHANGED   Details  atorvastatin (LIPITOR) 10 MG tablet Take 10 mg by mouth daily. Refills: 5    finasteride (PROSCAR) 5 MG tablet Take 1 tablet (5 mg total) by mouth daily. Qty: 30 tablet, Refills: 11    lisinopril (PRINIVIL,ZESTRIL) 10 MG tablet Take 10 mg by mouth daily.    metoprolol succinate (TOPROL-XL) 100 MG 24 hr tablet Take 100 mg by mouth daily. Refills: 5    omeprazole (PRILOSEC) 20 MG capsule Take 20 mg by mouth daily. Refills: 5    oxybutynin (DITROPAN) 5 MG tablet Take 5 mg by mouth every 8 (eight) hours as needed for bladder spasms.    phenazopyridine (PYRIDIUM) 100 MG tablet Take 1 tablet (100 mg total) by mouth 3 (three) times daily with meals. Qty: 10 tablet, Refills: 0    tamsulosin (FLOMAX) 0.4 MG CAPS capsule Take 1 capsule (0.4 mg total)  by mouth daily. Qty: 30 capsule, Refills: 5         DISCHARGE INSTRUCTIONS:   Activity as tolerated Diet heart healthy Follow-up with primary care physician in a week   DIET:  Cardiac diet  DISCHARGE CONDITION:  Fair  ACTIVITY:  Activity as tolerated  OXYGEN:  Home Oxygen: No.   Oxygen Delivery: room air  DISCHARGE LOCATION:  home   If you experience worsening of your admission symptoms, develop shortness of breath,  life threatening emergency, suicidal or homicidal thoughts you must seek medical attention immediately by calling 911 or calling your MD immediately  if symptoms less severe.  You Must read complete instructions/literature along with all the possible adverse reactions/side effects for all the Medicines you take and that have been prescribed to you. Take any new Medicines after you have completely understood and accpet all the possible adverse reactions/side effects.   Please note  You were cared for by a hospitalist during your hospital stay. If you have any questions about your discharge medications or the care you received while you were in the hospital after you are discharged, you can call the unit and asked to speak with the hospitalist on call if the hospitalist that took care of you is not available. Once you are discharged, your primary care physician will handle any further medical issues. Please note that NO REFILLS for any discharge medications will be authorized once you are discharged, as it is imperative that you return to your primary care physician (or establish a relationship with a primary care physician if you do not have one) for your aftercare needs so that they can reassess your need for medications and monitor your lab values.     Today  Chief Complaint  Patient presents with  . Nausea  . Emesis   Patient is feeling fine. No episodes of vomiting overnight. Denies any abdominal pain. Tolerated diet today. Wants to go home  ROS: CONSTITUTIONAL: Denies fevers, chills. Denies any fatigue, weakness.  EYES: Denies blurry vision, double vision, eye pain. EARS, NOSE, THROAT: Denies tinnitus, ear pain, hearing loss. RESPIRATORY: Denies cough, wheeze, shortness of breath.  CARDIOVASCULAR: Denies chest pain, palpitations, edema.  GASTROINTESTINAL: Denies nausea, vomiting, diarrhea, abdominal pain. Denies bright red blood per rectum. GENITOURINARY: Denies dysuria,  hematuria. ENDOCRINE: Denies nocturia or thyroid problems. HEMATOLOGIC AND LYMPHATIC: Denies easy bruising or bleeding. SKIN: Denies rash or lesion. MUSCULOSKELETAL: Denies pain in neck, back, shoulder, knees, hips or arthritic symptoms.  NEUROLOGIC: Denies paralysis, paresthesias.  PSYCHIATRIC: Denies anxiety or depressive symptoms.   VITAL SIGNS:  Blood pressure 120/54, pulse 71, temperature 98.8 F (37.1 C), temperature source Oral, resp. rate 20, height  (1.575 m), weight 50.531 kg (111 lb 6.4 oz), SpO2 98 %.  I/O:   Intake/Output Summary (Last 24 hours) at 12/23/15 1022 Last data filed at 12/23/15 0732  Gross per 24 hour  Intake   1170 ml  Output    650 ml  Net    520 ml    PHYSICAL EXAMINATION:  GENERAL:  65 y.o.-year-old patient lying in the bed with no acute distress.  EYES: Pupils equal, round, reactive to light and accommodation. No scleral icterus. Extraocular muscles intact.  HEENT: Head atraumatic, normocephalic. Oropharynx and nasopharynx clear.  NECK:  Supple, no jugular venous distention. No thyroid enlargement, no tenderness.  LUNGS: Normal breath sounds bilaterally, no wheezing, rales,rhonchi or crepitation. No use of accessory muscles of respiration.  CARDIOVASCULAR: S1, S2 normal. No murmurs, rubs, or  gallops.  ABDOMEN: Soft, non-tender, non-distended. Bowel sounds present. No organomegaly or mass.  EXTREMITIES: No pedal edema, cyanosis, or clubbing.  NEUROLOGIC: Cranial nerves II through XII are intact. Muscle strength 5/5 in all extremities. Sensation intact. Gait not checked.  PSYCHIATRIC: The patient is alert and oriented x 3.  SKIN: No obvious rash, lesion, or ulcer.   DATA REVIEW:   CBC  Recent Labs Lab 12/22/15 0435  WBC 6.0  HGB 11.9*  HCT 35.7*  PLT 334    Chemistries   Recent Labs Lab 12/21/15 1842 12/22/15 0435  NA 138 141  K 4.0 4.1  CL 105 111  CO2 23 19*  GLUCOSE 106* 105*  BUN 20 20  CREATININE 1.35* 1.30*  CALCIUM  10.0 9.3  AST 20  --   ALT 10*  --   ALKPHOS 113  --   BILITOT 1.0  --     Cardiac Enzymes  Recent Labs Lab 12/21/15 1842  TROPONINI <0.03    Microbiology Results  Results for orders placed or performed during the hospital encounter of 12/21/15  Chlamydia/NGC rt PCR ([facility] only)     Status: None   Collection Time: 12/21/15  2:00 PM  Result Value Ref Range Status   Specimen source GC/Chlam URINE, RANDOM  Final   Chlamydia Tr NOT DETECTED NOT DETECTED Final   N gonorrhoeae NOT DETECTED NOT DETECTED Final  Urine culture     Status: None   Collection Time: 12/21/15  8:31 PM  Result Value Ref Range Status   Specimen Description URINE, CLEAN CATCH  Final   Special Requests NONE  Final   Culture >=100,000 COLONIES/mL ESCHERICHIA COLI  Final   Report Status 12/23/2015 FINAL  Final   Organism ID, Bacteria ESCHERICHIA COLI  Final      Susceptibility   Escherichia coli - MIC*    AMPICILLIN <=2 SENSITIVE Sensitive     CEFAZOLIN <=4 SENSITIVE Sensitive     CEFTRIAXONE <=1 SENSITIVE Sensitive     CIPROFLOXACIN <=0.25 SENSITIVE Sensitive     GENTAMICIN <=1 SENSITIVE Sensitive     IMIPENEM <=0.25 SENSITIVE Sensitive     NITROFURANTOIN <=16 SENSITIVE Sensitive     TRIMETH/SULFA <=20 SENSITIVE Sensitive     AMPICILLIN/SULBACTAM <=2 SENSITIVE Sensitive     PIP/TAZO <=4 SENSITIVE Sensitive     Extended ESBL NEGATIVE Sensitive     * >=100,000 COLONIES/mL ESCHERICHIA COLI    RADIOLOGY:  No results found.  EKG:   Orders placed or performed during the hospital encounter of 12/21/15  . ED EKG  . ED EKG      Management plans discussed with the patient, family and they are in agreement.  CODE STATUS:     Code Status Orders        Start     Ordered   12/21/15 2315  Full code   Continuous     12/21/15 2314    Code Status History    Date Active Date Inactive Code Status Order ID Comments User Context   11/12/2015  2:13 AM 11/12/2015  9:29 PM Full Code [id number]  Oralia Manis,  MD Inpatient      TOTAL TIME TAKING CARE OF THIS PATIENT: 45  minutes.    @  on 12/23/2015 at 10:22 AM  Between 7am to 6pm - Pager - [phone]  After 6pm go to [email or web address] - password [email or web address]  [facility]  Office  [phone]  CC: Primary care physician; Pcp Not In System

## 2015-12-23 NOTE — Care Management (Signed)
Attending and nursing reports no discharge needs identified

## 2015-12-29 NOTE — Discharge Summary (Deleted)
[facility] [place] at [facility]   PATIENT NAME: Antonio Ford    MR#:  [id number]  DATE OF BIRTH:  01-07-1951  DATE OF ADMISSION:  12/21/2015 ADMITTING PHYSICIAN: Oralia Manis, MD  DATE OF DISCHARGE: 12/23/2015  7:13 PM  PRIMARY CARE PHYSICIAN: Pcp Not In System    ADMISSION DIAGNOSIS:  UTI (lower urinary tract infection) [N39.0] Intractable vomiting with nausea, vomiting of unspecified type [R11.10]  DISCHARGE DIAGNOSIS:  Principal Problem:   UTI (lower urinary tract infection) Active Problems:   HTN (hypertension)   HLD (hyperlipidemia)   GERD (gastroesophageal reflux disease)   CAD (coronary artery disease)   BPH (benign prostatic hyperplasia)   CKD (chronic kidney disease), stage III   Intractable nausea and vomiting   SECONDARY DIAGNOSIS:   Past Medical History  Diagnosis Date  . HTN (hypertension)   . HLD (hyperlipidemia)   . GERD (gastroesophageal reflux disease)   . CAD (coronary artery disease)     HOSPITAL COURSE:   # UTI (lower urinary tract infection)  Urine cultures with Escherichia coli sensitive to Cipro Given IV Rocephin and IV fluids during hospitalcourse  # BPH (benign prostatic hyperplasia) - continue home meds  # Intractable nausea and vomiting -  alternated different classes of antiemetics here for nausea control Tolerated advanced diet PPI    #HTN (hypertension) - continue home meds  # CAD (coronary artery disease) - continue home meds  # HLD (hyperlipidemia) - continue home meds   #GERD (gastroesophageal reflux disease) - PPI   #CKD (chronic kidney disease), stage III - seems to be at baseline, stable, avoid nephrotoxins, monitor       DISCHARGE CONDITIONS:   fair  CONSULTS OBTAINED:      PROCEDURES  none  DRUG ALLERGIES:  No Known Allergies  DISCHARGE MEDICATIONS:   Discharge Medication List as of 12/23/2015  5:03 PM    START taking these medications   Details   ciprofloxacin (CIPRO) 500 MG tablet Take 1 tablet (500 mg total) by mouth 2 (two) times daily., Starting 12/23/2015, Until Thu 12/29/15, Print    docusate sodium (COLACE) 100 MG capsule Take 1 capsule (100 mg total) by mouth 2 (two) times daily as needed for mild constipation., Starting 12/23/2015, Until Discontinued, OTC    promethazine (PHENERGAN) 12.5 MG suppository Place 1 suppository (12.5 mg total) rectally every 8 (eight) hours as needed for nausea or vomiting., Starting 12/23/2015, Until Discontinued, Print      CONTINUE these medications which have NOT CHANGED   Details  atorvastatin (LIPITOR) 10 MG tablet Take 10 mg by mouth daily., Starting 11/04/2015, Until Discontinued, Historical Med    finasteride (PROSCAR) 5 MG tablet Take 1 tablet (5 mg total) by mouth daily., Starting 11/16/2015, Until Discontinued, Normal    lisinopril (PRINIVIL,ZESTRIL) 10 MG tablet Take 10 mg by mouth daily., Until Discontinued, Historical Med    metoprolol succinate (TOPROL-XL) 100 MG 24 hr tablet Take 100 mg by mouth daily., Starting 10/27/2015, Until Discontinued, Historical Med    omeprazole (PRILOSEC) 20 MG capsule Take 20 mg by mouth daily., Starting 10/31/2015, Until Discontinued, Historical Med    oxybutynin (DITROPAN) 5 MG tablet Take 5 mg by mouth every 8 (eight) hours as needed for bladder spasms., Until Discontinued, Historical Med    phenazopyridine (PYRIDIUM) 100 MG tablet Take 1 tablet (100 mg total) by mouth 3 (three) times daily with meals., Starting 11/12/2015, Until Discontinued, Normal    tamsulosin (FLOMAX) 0.4 MG CAPS capsule Take 1 capsule (  0.4 mg total) by mouth daily., Starting 11/12/2015, Until Discontinued, Normal         DISCHARGE INSTRUCTIONS:     DIET:  Low salt  DISCHARGE CONDITION:  fair  ACTIVITY:  As tolerated  OXYGEN:  Home Oxygen: no   Oxygen Delivery: none  DISCHARGE LOCATION:  home  If you experience worsening of your admission symptoms, develop  shortness of breath, life threatening emergency, suicidal or homicidal thoughts you must seek medical attention immediately by calling 911 or calling your MD immediately  if symptoms less severe.  You Must read complete instructions/literature along with all the possible adverse reactions/side effects for all the Medicines you take and that have been prescribed to you. Take any new Medicines after you have completely understood and accpet all the possible adverse reactions/side effects.   Please note  You were cared for by a hospitalist during your hospital stay. If you have any questions about your discharge medications or the care you received while you were in the hospital after you are discharged, you can call the unit and asked to speak with the hospitalist on call if the hospitalist that took care of you is not available. Once you are discharged, your primary care physician will handle any further medical issues. Please note that NO REFILLS for any discharge medications will be authorized once you are discharged, as it is imperative that you return to your primary care physician (or establish a relationship with a primary care physician if you do not have one) for your aftercare needs so that they can reassess your need for medications and monitor your lab values.     Today  Chief Complaint  Patient presents with  . Nausea  . Emesis   Pt is feeling fine , tolerating advanced diet, nausea and vomiting resolved  ROS:  CONSTITUTIONAL: Denies fevers, chills. Denies any fatigue, weakness.  EYES: Denies blurry vision, double vision, eye pain. EARS, NOSE, THROAT: Denies tinnitus, ear pain, hearing loss. RESPIRATORY: Denies cough, wheeze, shortness of breath.  CARDIOVASCULAR: Denies chest pain, palpitations, edema.  GASTROINTESTINAL: Denies nausea, vomiting, diarrhea, abdominal pain. Denies bright red blood per rectum. GENITOURINARY: Denies dysuria, hematuria. ENDOCRINE: Denies nocturia or  thyroid problems. HEMATOLOGIC AND LYMPHATIC: Denies easy bruising or bleeding. SKIN: Denies rash or lesion. MUSCULOSKELETAL: Denies pain in neck, back, shoulder, knees, hips or arthritic symptoms.  NEUROLOGIC: Denies paralysis, paresthesias.  PSYCHIATRIC: Denies anxiety or depressive symptoms.   VITAL SIGNS:  Blood pressure 122/66, pulse 76, temperature 98.7 F (37.1 C), temperature source Oral, resp. rate 20, height  (1.575 m), weight 50.531 kg (111 lb 6.4 oz), SpO2 98 %.  I/O:  No intake or output data in the 24 hours ending 12/29/15 2309  PHYSICAL EXAMINATION:  GENERAL:  65 y.o.-year-old patient lying in the bed with no acute distress.  EYES: Pupils equal, round, reactive to light and accommodation. No scleral icterus. Extraocular muscles intact.  HEENT: Head atraumatic, normocephalic. Oropharynx and nasopharynx clear.  NECK:  Supple, no jugular venous distention. No thyroid enlargement, no tenderness.  LUNGS: Normal breath sounds bilaterally, no wheezing, rales,rhonchi or crepitation. No use of accessory muscles of respiration.  CARDIOVASCULAR: S1, S2 normal. No murmurs, rubs, or gallops.  ABDOMEN: Soft, non-tender, non-distended. Bowel sounds present. No organomegaly or mass.  EXTREMITIES: No pedal edema, cyanosis, or clubbing.  NEUROLOGIC: Cranial nerves II through XII are intact. Muscle strength 5/5 in all extremities. Sensation intact. Gait not checked.  PSYCHIATRIC: The patient is alert and oriented x 3.  SKIN: No obvious rash, lesion, or ulcer.   DATA REVIEW:   CBC No results for input(s): WBC, HGB, HCT, PLT in the last 168 hours.  Chemistries  No results for input(s): NA, K, CL, CO2, GLUCOSE, BUN, CREATININE, CALCIUM, MG, AST, ALT, ALKPHOS, BILITOT in the last 168 hours.  Invalid input(s): GFRCGP  Cardiac Enzymes No results for input(s): TROPONINI in the last 168 hours.  Microbiology Results  Results for orders placed or performed during the hospital  encounter of 12/21/15  Chlamydia/NGC rt PCR ([facility] only)     Status: None   Collection Time: 12/21/15  2:00 PM  Result Value Ref Range Status   Specimen source GC/Chlam URINE, RANDOM  Final   Chlamydia Tr NOT DETECTED NOT DETECTED Final   N gonorrhoeae NOT DETECTED NOT DETECTED Final  Urine culture     Status: None   Collection Time: 12/21/15  8:31 PM  Result Value Ref Range Status   Specimen Description URINE, CLEAN CATCH  Final   Special Requests NONE  Final   Culture >=100,000 COLONIES/mL ESCHERICHIA COLI  Final   Report Status 12/23/2015 FINAL  Final   Organism ID, Bacteria ESCHERICHIA COLI  Final      Susceptibility   Escherichia coli - MIC*    AMPICILLIN <=2 SENSITIVE Sensitive     CEFAZOLIN <=4 SENSITIVE Sensitive     CEFTRIAXONE <=1 SENSITIVE Sensitive     CIPROFLOXACIN <=0.25 SENSITIVE Sensitive     GENTAMICIN <=1 SENSITIVE Sensitive     IMIPENEM <=0.25 SENSITIVE Sensitive     NITROFURANTOIN <=16 SENSITIVE Sensitive     TRIMETH/SULFA <=20 SENSITIVE Sensitive     AMPICILLIN/SULBACTAM <=2 SENSITIVE Sensitive     PIP/TAZO <=4 SENSITIVE Sensitive     Extended ESBL NEGATIVE Sensitive     * >=100,000 COLONIES/mL ESCHERICHIA COLI    RADIOLOGY:  No results found.  EKG:   Orders placed or performed during the hospital encounter of 12/21/15  . ED EKG  . ED EKG  . EKG      Management plans discussed with the patient, family and they are in agreement.  CODE STATUS:  Code Status History    Date Active Date Inactive Code Status Order ID Comments User Context   12/21/2015 11:14 PM 12/23/2015 10:13 PM Full Code [id number]  Oralia Manis, MD Inpatient   11/12/2015  2:13 AM 11/12/2015  9:29 PM Full Code [id number]  Oralia Manis, MD Inpatient      TOTAL TIME TAKING CARE OF THIS PATIENT: 45 minutes.    @  on 12/29/2015 at 11:09 PM  Between 7am to 6pm - Pager - [phone]  After 6pm go to [email or web address] - password [email or web address]  [facility]  Office   [phone]  CC: Primary care physician; Pcp Not In System

## 2016-01-10 ENCOUNTER — Encounter: Payer: Self-pay | Admitting: Urology

## 2016-02-15 ENCOUNTER — Ambulatory Visit: Payer: Medicare Other | Admitting: Urology

## 2016-02-15 ENCOUNTER — Ambulatory Visit: Payer: Medicare Other

## 2016-02-15 ENCOUNTER — Encounter: Payer: Self-pay | Admitting: Urology

## 2016-03-28 ENCOUNTER — Encounter: Payer: Self-pay | Admitting: Emergency Medicine

## 2016-03-28 ENCOUNTER — Emergency Department
Admission: EM | Admit: 2016-03-28 | Discharge: 2016-03-28 | Disposition: A | Discharge status: Home / Self Care | Payer: Medicare Other | Attending: Emergency Medicine | Admitting: Emergency Medicine

## 2016-03-28 DIAGNOSIS — I129 Hypertensive chronic kidney disease with stage 1 through stage 4 chronic kidney disease, or unspecified chronic kidney disease: Secondary | ICD-10-CM | POA: Insufficient documentation

## 2016-03-28 DIAGNOSIS — Z79899 Other long term (current) drug therapy: Secondary | ICD-10-CM | POA: Insufficient documentation

## 2016-03-28 DIAGNOSIS — I2510 Atherosclerotic heart disease of native coronary artery without angina pectoris: Secondary | ICD-10-CM | POA: Insufficient documentation

## 2016-03-28 DIAGNOSIS — Z87891 Personal history of nicotine dependence: Secondary | ICD-10-CM | POA: Insufficient documentation

## 2016-03-28 DIAGNOSIS — N183 Chronic kidney disease, stage 3 (moderate): Secondary | ICD-10-CM | POA: Insufficient documentation

## 2016-03-28 DIAGNOSIS — E785 Hyperlipidemia, unspecified: Secondary | ICD-10-CM | POA: Diagnosis not present

## 2016-03-28 DIAGNOSIS — F1290 Cannabis use, unspecified, uncomplicated: Secondary | ICD-10-CM | POA: Diagnosis not present

## 2016-03-28 DIAGNOSIS — K122 Cellulitis and abscess of mouth: Secondary | ICD-10-CM | POA: Diagnosis not present

## 2016-03-28 DIAGNOSIS — K0889 Other specified disorders of teeth and supporting structures: Secondary | ICD-10-CM | POA: Diagnosis present

## 2016-03-28 MED ORDER — AMOXICILLIN 500 MG PO TABS: 500.0000 mg | ORAL_TABLET | Freq: Three times a day (TID) | ORAL | Status: AC

## 2016-03-28 MED ORDER — MAGIC MOUTHWASH W/LIDOCAINE: 5.0000 mL | Freq: Four times a day (QID) | ORAL | Status: AC | PRN

## 2016-03-28 NOTE — ED Notes (Signed)
Patient to ER for pain to right lower dentition. States "I had 5-6 teeth pulled in that area ears ago and it's a little sore again".

## 2016-03-28 NOTE — ED Notes (Signed)
Discussed discharge instructions, prescriptions, and follow-up care with patient. No questions or concerns at this time. Pt stable at discharge.  

## 2016-03-28 NOTE — ED Notes (Signed)
Pt states he began to have R jaw pain and swelling yesterday.  Pt had teeth pulled approximately 8-10 years ago, but does not wear the false teeth.

## 2016-03-28 NOTE — Discharge Instructions (Signed)
Please go to the dental clinic at [place] tomorrow for evaluation.  Dental Abscess    A dental abscess is pus in or around a tooth.  HOME CARE  Take medicines only as told by your dentist.  If you were prescribed antibiotic medicine, finish all of it even if you start to feel better.  Rinse your mouth (gargle) often with salt water.  Do not drive or use heavy machinery, like a lawn mower, while taking pain medicine.  Do not apply heat to the outside of your mouth.  Keep all follow-up visits as told by your dentist. This is important. GET HELP IF:  Your pain is worse, and medicine does not help. GET HELP RIGHT AWAY IF:  You have a fever or chills.  Your symptoms suddenly get worse.  You have a very bad headache.  You have problems breathing or swallowing.  You have trouble opening your mouth.  You have puffiness (swelling) in your neck or around your eye. This information is not intended to replace advice given to you by your health care provider. Make sure you discuss any questions you have with your health care provider.  Document Released: 03/08/2015 Document Reviewed: 03/08/2015  Elsevier Interactive Patient Education [employer]2016 [employer]vier Inc.

## 2016-03-28 NOTE — ED Provider Notes (Signed)
[facility] Emergency Department Provider Note  ____________________________________________  Time seen: Approximately 6:43 PM  I have reviewed the triage vital signs and the nursing notes.   HISTORY  Chief Complaint Dental Pain    HPI Antonio Ford is a 65 y.o. male , NAD, presents to the emergency department with one-day history of right lower gum pain and swelling. Patient is accompanied by his wife who assists with history. States that he has had all of his teeth removed in the remote past. Does have dentures but does not wear them per his choosing. States over the last 24 hours he's noted some redness and swelling about his right lower gumline that is starting to affect his right lower jaw. Denies any fever, chills, body aches, abdominal pain, nausea, vomiting. Has not had any chest pain, shortness of breath, visual changes. Denies any difficulty eating or swallowing. Has been able to swallow liquids and solids without difficulty.   Past Medical History  Diagnosis Date  . HTN (hypertension)   . HLD (hyperlipidemia)   . GERD (gastroesophageal reflux disease)   . CAD (coronary artery disease)     Patient Active Problem List   Diagnosis Date Noted  . CKD (chronic kidney disease), stage III 12/21/2015  . UTI (lower urinary tract infection) 12/21/2015  . Intractable nausea and vomiting 12/21/2015  . Elevated troponin 11/12/2015  . Hyponatremia 11/12/2015  . Dehydration 11/12/2015  . Leukocytosis 11/12/2015  . BPH (benign prostatic hyperplasia) 11/12/2015  . Acute hemorrhagic cystitis 11/11/2015  . HTN (hypertension) 11/11/2015  . HLD (hyperlipidemia) 11/11/2015  . GERD (gastroesophageal reflux disease) 11/11/2015  . CAD (coronary artery disease) 11/11/2015  . AKI (acute kidney injury) (HCC) 11/11/2015  . Angina pectoris (HCC) 03/25/2014  . Chest pain 03/25/2014  . Current smoker 03/25/2014  . Current tobacco use 03/31/2013  . Injury of kidney  03/30/2013  . Elevated troponin I level 03/30/2013  . Abnormal blood chemistry 03/30/2013  . Traumatic intracranial subarachnoid hemorrhage (HCC) 03/27/2013    Past Surgical History  Procedure Laterality Date  . Hernia repair    . Cardiac catheterization      Current Outpatient Rx  Name  Route  Sig  Dispense  Refill  . amoxicillin (AMOXIL) 500 MG tablet   Oral   Take 1 tablet (500 mg total) by mouth 3 (three) times daily with meals.   30 tablet   0   . atorvastatin (LIPITOR) 10 MG tablet   Oral   Take 10 mg by mouth daily.      5   . docusate sodium (COLACE) 100 MG capsule   Oral   Take 1 capsule (100 mg total) by mouth 2 (two) times daily as needed for mild constipation.   10 capsule   0   . finasteride (PROSCAR) 5 MG tablet   Oral   Take 1 tablet (5 mg total) by mouth daily.   30 tablet   11   . lisinopril (PRINIVIL,ZESTRIL) 10 MG tablet   Oral   Take 10 mg by mouth daily.         . magic mouthwash w/lidocaine SOLN   Oral   Take 5 mLs by mouth 4 (four) times daily as needed for mouth pain.   240 mL   0     Please mix 80mL diphenhydramine, 80mL nystatin, 80 ...   . metoprolol succinate (TOPROL-XL) 100 MG 24 hr tablet   Oral   Take 100 mg by mouth daily.  5   . omeprazole (PRILOSEC) 20 MG capsule   Oral   Take 20 mg by mouth daily.      5   . oxybutynin (DITROPAN) 5 MG tablet   Oral   Take 5 mg by mouth every 8 (eight) hours as needed for bladder spasms.         . phenazopyridine (PYRIDIUM) 100 MG tablet   Oral   Take 1 tablet (100 mg total) by mouth 3 (three) times daily with meals.   10 tablet   0   . promethazine (PHENERGAN) 12.5 MG suppository   Rectal   Place 1 suppository (12.5 mg total) rectally every 8 (eight) hours as needed for nausea or vomiting.   12 each   0   . tamsulosin (FLOMAX) 0.4 MG CAPS capsule   Oral   Take 1 capsule (0.4 mg total) by mouth daily.   30 capsule   5     Allergies Review of patient's  allergies indicates no known allergies.  Family History  Problem Relation Age of Onset  . Diabetes Sister   . Prostate cancer Neg Hx   . Bladder Cancer Neg Hx   . Kidney cancer Neg Hx   . Stroke Mother     Social History Social History  Substance Use Topics  . Smoking status: Former Games developermoker  . Smokeless tobacco: None     Comment: quit on 11-14-15  . Alcohol Use: No     Review of Systems  Constitutional: No fever/chills, fatigue Eyes: No visual changes.  ENT: Positive right lower dental pain. No sore throat, difficulty swallowing. Cardiovascular: No chest pain. Respiratory:  No shortness of breath. No wheezing.  Gastrointestinal: No abdominal pain.  No nausea, vomiting.   Musculoskeletal: Positive right lower jaw pain without masticatory fatigue. Negative for neck pain.  Skin: Negative for rash, skin sores, open wounds. Neurological: Negative for headaches, focal weakness or numbness. No tingling. 10-point ROS otherwise negative.  ____________________________________________   PHYSICAL EXAM:  VITAL SIGNS: ED Triage Vitals  Enc Vitals Group     BP 03/28/16 1808 124/61 mmHg     Pulse Rate 03/28/16 1808 76     Resp 03/28/16 1808 18     Temp 03/28/16 1808 97.7 F (36.5 C)     Temp Source 03/28/16 1808 Oral     SpO2 03/28/16 1808 100 %     Weight 03/28/16 1808 117 lb (53.071 kg)     Height 03/28/16 1808 5\' 2"  (1.575 m)     Head Cir --      Peak Flow --      Pain Score 03/28/16 1809 1     Pain Loc --      Pain Edu? --      Excl. in GC? --      Constitutional: Alert and oriented. Well appearing and in no acute distress. Eyes: Conjunctivae are normal.  Head: Atraumatic.No temporal pain with palpation. ENT:      Mouth/Throat: Right lower posterior gumline with mild erythema and swelling. Area is moderately tender to palpation. No open wounds or lesions. No teeth are present in the mouth. All other gum lines are without abnormality. Mucous membranes are moist. Pharynx  without erythema, exudates, swelling.  Neck: No stridor. Supple with full range of motion. Hematological/Lymphatic/Immunilogical: No cervical lymphadenopathy. Cardiovascular:  Good peripheral circulation. Respiratory: Normal respiratory effort without tachypnea or retractions.  Musculoskeletal: Mild tenderness to palpation about the right lower extremity or jaw line. No TMJ tenderness  or crepitus with opening of the mouth.  Neurologic:  Normal speech and language. No gross focal neurologic deficits are appreciated. Gait and posture are normal. Skin:  Skin is warm, dry and intact. No rash noted. Psychiatric: Mood and affect are normal. Speech and behavior are normal. Patient exhibits appropriate insight and judgement.   ____________________________________________   LABS  None ____________________________________________  EKG  None ____________________________________________  RADIOLOGY  None ____________________________________________    PROCEDURES  Procedure(s) performed: None    Medications - No data to display   ____________________________________________   INITIAL IMPRESSION / ASSESSMENT AND PLAN / ED COURSE  Patient's diagnosis is consistent with cellulitis and abscess of oral soft tissues. Patient will be discharged home with prescriptions for amoxicillin and Magic mouthwash with lidocaine to take as directed. Patient is to follow up with [facility] dental clinic tomorrow for recheck. Patient is given ED precautions to return to the ED for any worsening or new symptoms.    ____________________________________________  FINAL CLINICAL IMPRESSION(S) / ED DIAGNOSES  Final diagnoses:  Cellulitis and abscess of oral soft tissues      NEW MEDICATIONS STARTED DURING THIS VISIT:  Discharge Medication List as of 03/28/2016  6:45 PM    START taking these medications   Details  amoxicillin (AMOXIL) 500 MG tablet Take 1 tablet (500 mg total) by mouth 3  (three) times daily with meals., Starting 03/28/2016, Until Discontinued, Print    magic mouthwash w/lidocaine SOLN Take 5 mLs by mouth 4 (four) times daily as needed for mouth pain., Starting 03/28/2016, Until Discontinued, Print             Hope Pigeon, PA-C 03/28/16 1942  Minna Antis, MD 03/28/16 [phone]

## 2016-05-05 DEATH — deceased

## 2016-06-10 IMAGING — CT CT RENAL STONE PROTOCOL
1 of 2 series · 2 of 32 positions shown, 5 images · non-contrast
Comparison: 01/13/2013

CLINICAL DATA: Hematuria today.  Pelvic region pain.

EXAM:
CT ABDOMEN AND PELVIS WITHOUT CONTRAST
TECHNIQUE: Multidetector CT imaging of the abdomen and pelvis was performed
following the standard protocol without IV contrast.

[Series 4: lung windows · axial · 0.61mm/px · z∈[-106,-86]mm · 2 of 13 slices shown, 5 images]
[im 5/13  soft-tissue]
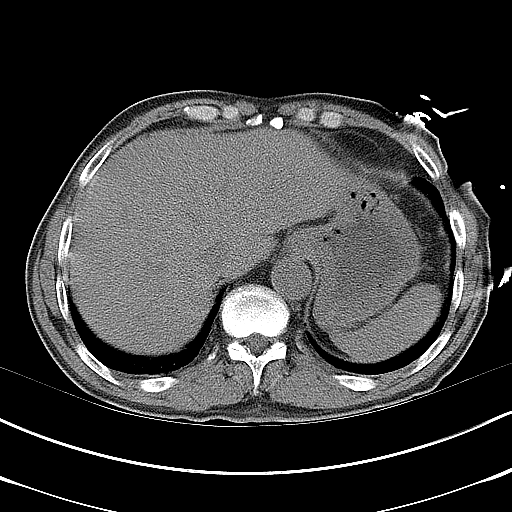
[im 5/13  lung]
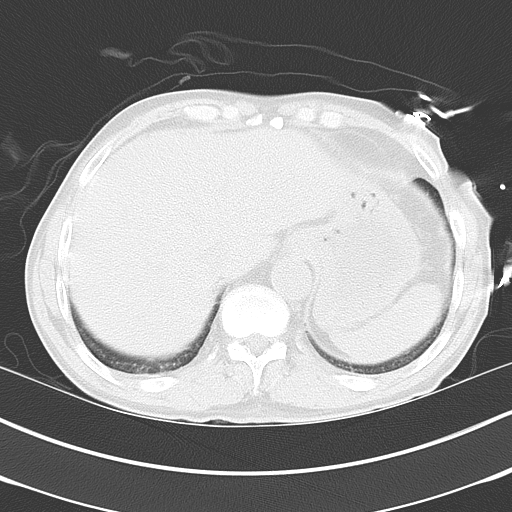
[im 5/13  bone]
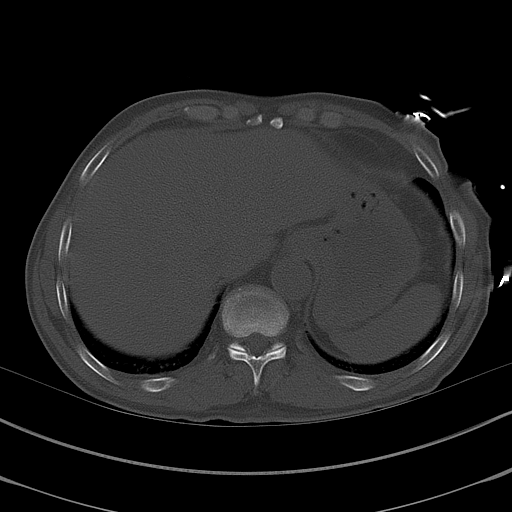
[im 9/13  soft-tissue]
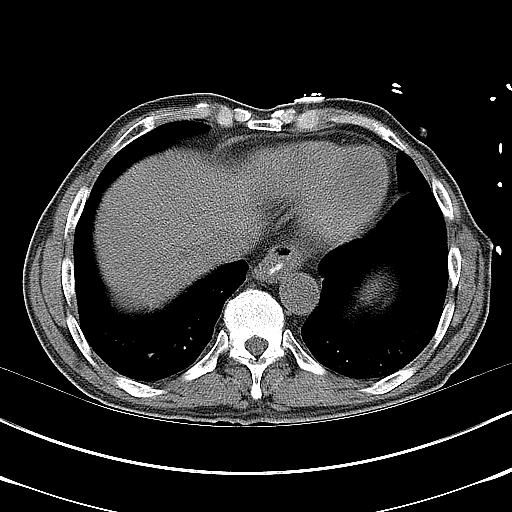
[im 9/13  lung]
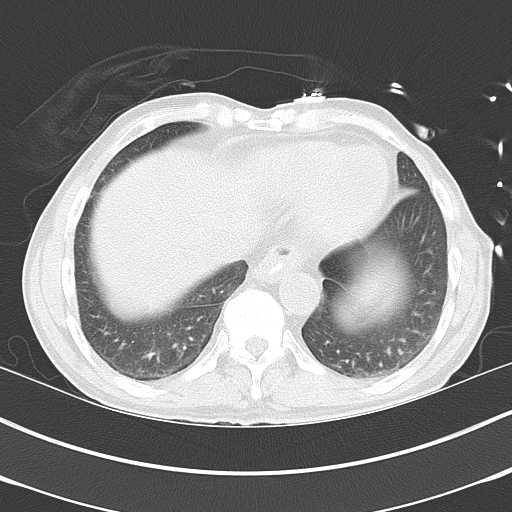

[2 of 32 positions shown; findings below may reference images not displayed]

FINDINGS: Lung bases:  Minor subsegmental atelectasis.  Heart normal in size.

Liver, spleen, gallbladder, pancreas, adrenal glands:  Normal.

Kidneys, ureters, bladder: No renal masses. No stones. No
hydronephrosis.

Prostate gland is enlarged. It indents the bladder base. Bladder
wall is thickened. No discrete bladder mass is seen. No bladder
stone. Prostate measures 5.9 x 5.2 x 5.7 cm. These findings are
similar to the prior CT.

Lymph nodes:  No pathologically enlarged lymph nodes.

Ascites:  None.

Gastrointestinal: Unremarkable. Appendix not visualized. No evidence
of appendicitis.

Musculoskeletal: Degenerative changes of the visualized spine,
relatively mild. No osteoblastic or osteolytic lesions.
IMPRESSION: 1. No definite acute findings. No renal or ureteral stones. No
obstructive uropathy.
2. Significant prostate enlargement. Prostate indents the inferior
bladder. Bladder wall is thickened. Findings are similar to the
prior CT likely due to chronic bladder outlet obstruction from
benign prostatic hypertrophy. Consider acute cystitis if there are
consistent clinical symptoms.

## 2016-07-22 IMAGING — DX DG ABDOMEN 1V
1 series · 1 of 1 positions shown · non-contrast
Comparison: CT, 11/11/2015

CLINICAL DATA: Pt is persistently nauseated and still vomiting.
Denies any abdominal pain. Denies any hematemesis. Could not
tolerate clear liquids.. F/u exam vomiting

EXAM:
ABDOMEN - 1 VIEW

[abdomen kub]
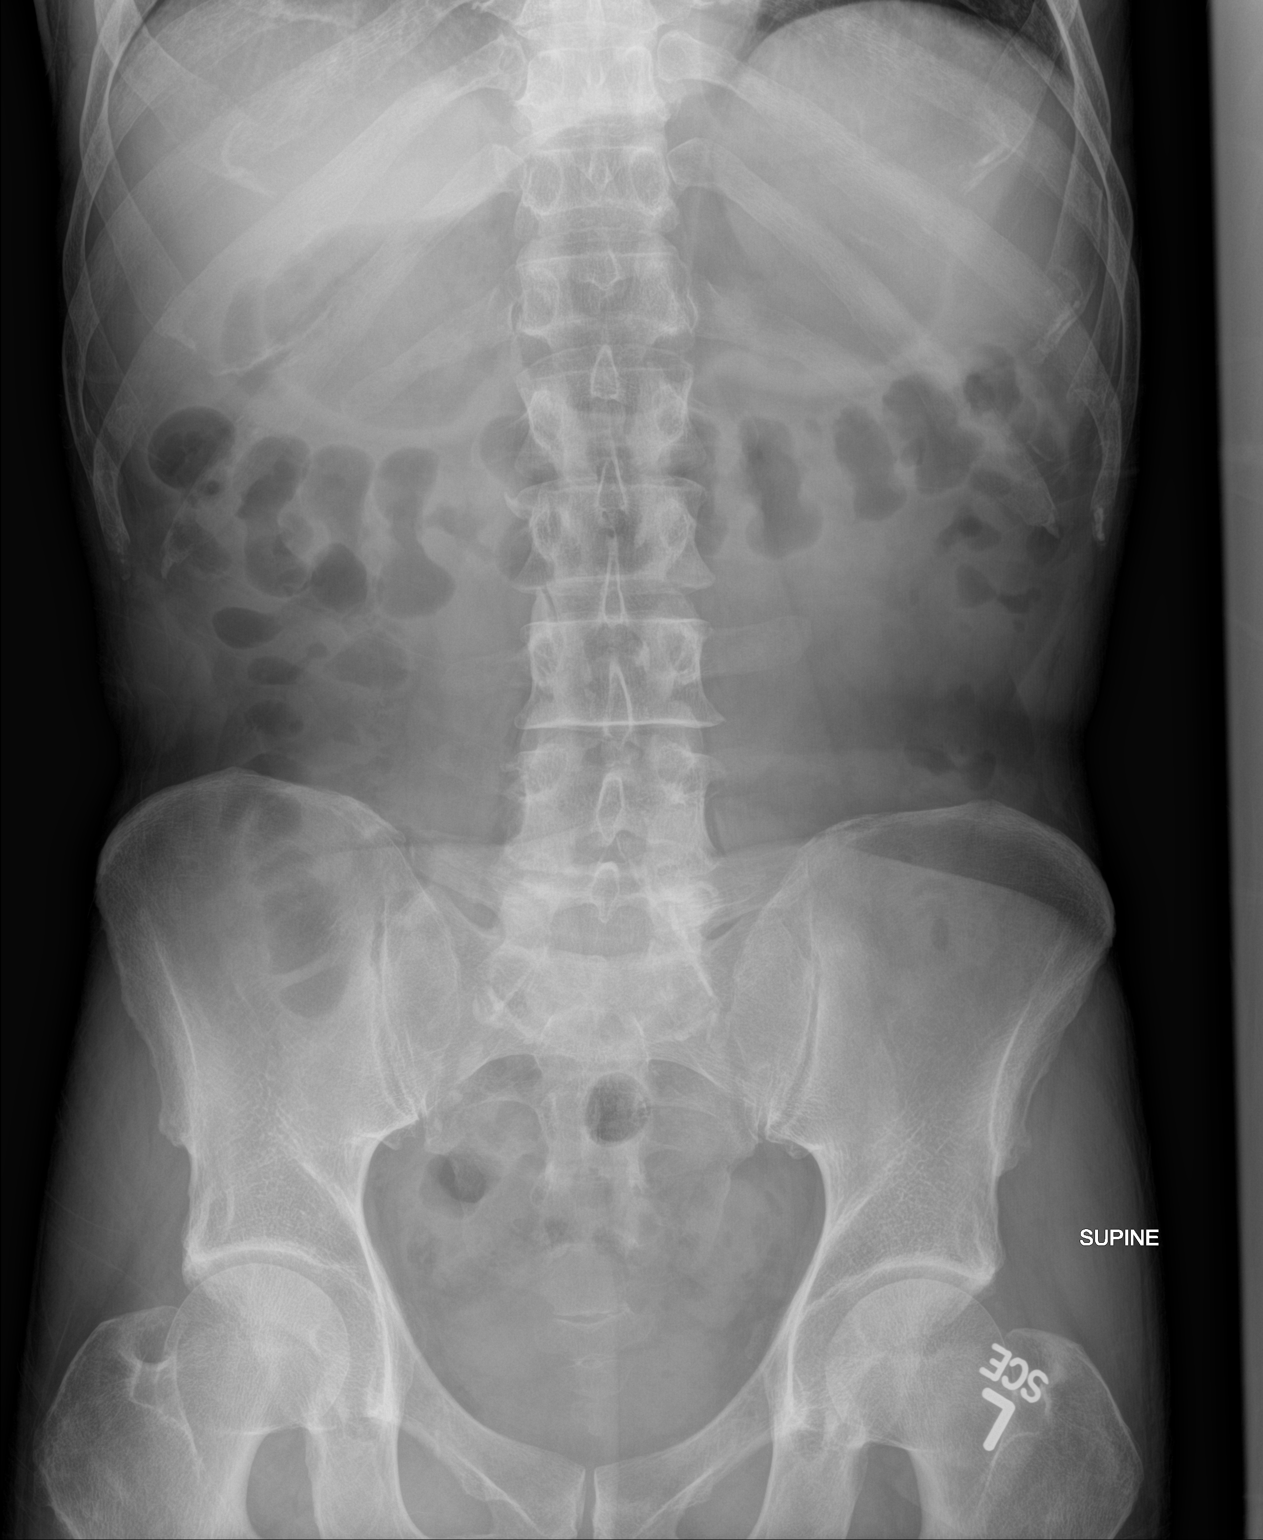

[1 of 1 positions shown; findings below may reference images not displayed]

FINDINGS: The bowel gas pattern is normal. No radio-opaque calculi or other
significant radiographic abnormality are seen.
IMPRESSION: Negative.
